# Patient Record
Sex: Male | Born: 1995 | Race: White | Hispanic: No | Marital: Single
Health system: Southern US, Community
[De-identification: ages and names within clinical notes are randomized; demographics above are authoritative.]

## PROBLEM LIST (undated history)

## (undated) DIAGNOSIS — F909 Attention-deficit hyperactivity disorder, unspecified type: Secondary | ICD-10-CM

## (undated) DIAGNOSIS — F319 Bipolar disorder, unspecified: Secondary | ICD-10-CM

## (undated) DIAGNOSIS — J309 Allergic rhinitis, unspecified: Secondary | ICD-10-CM

## (undated) DIAGNOSIS — R002 Palpitations: Secondary | ICD-10-CM

## (undated) HISTORY — DX: Bipolar disorder, unspecified: F31.9

## (undated) HISTORY — DX: Allergic rhinitis, unspecified: J30.9

## (undated) HISTORY — DX: Attention-deficit hyperactivity disorder, unspecified type: F90.9

## (undated) HISTORY — DX: Palpitations: R00.2

## (undated) HISTORY — PX: NO PAST SURGERIES: SHX2092

---
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
## 2015-11-10 ENCOUNTER — Encounter: Payer: Self-pay | Admitting: Family Medicine

## 2015-11-10 ENCOUNTER — Ambulatory Visit (INDEPENDENT_AMBULATORY_CARE_PROVIDER_SITE_OTHER): Payer: 59 | Admitting: Family Medicine

## 2015-11-10 VITALS — BP 125/81 | HR 79 | Temp 98.5°F | Wt 142.0 lb

## 2015-11-10 DIAGNOSIS — H6120 Impacted cerumen, unspecified ear: Secondary | ICD-10-CM | POA: Insufficient documentation

## 2015-11-10 DIAGNOSIS — H6122 Impacted cerumen, left ear: Secondary | ICD-10-CM | POA: Diagnosis not present

## 2015-11-10 DIAGNOSIS — H9192 Unspecified hearing loss, left ear: Secondary | ICD-10-CM | POA: Diagnosis not present

## 2015-11-10 MED ORDER — CARBAMIDE PEROXIDE 6.5 % OT SOLN: 5.0000 [drp] | Freq: Two times a day (BID) | OTIC | Status: DC

## 2015-11-10 NOTE — Patient Instructions (Signed)
Cerumen Impaction The structures of the external ear canal secrete a waxy substance known as cerumen. Excess cerumen can build up in the ear canal, causing a condition known as cerumen impaction. Cerumen impaction can cause ear pain and disrupt the function of the ear. The rate of cerumen production differs for each individual. In certain individuals, the configuration of the ear canal may decrease his or her ability to naturally remove cerumen. CAUSES Cerumen impaction is caused by excessive cerumen production or buildup. RISK FACTORS  Frequent use of swabs to clean ears.  Having narrow ear canals.  Having eczema.  Being dehydrated. SIGNS AND SYMPTOMS  Diminished hearing.  Ear drainage.  Ear pain.  Ear itch. TREATMENT Treatment may involve:  Over-the-counter or prescription ear drops to soften the cerumen.  Removal of cerumen by a health care provider. This may be done with:  Irrigation with warm water. This is the most common method of removal.  Ear curettes and other instruments.  Surgery. This may be done in severe cases. HOME CARE INSTRUCTIONS  Take medicines only as directed by your health care provider.  Do not insert objects into the ear with the intent of cleaning the ear. PREVENTION  Do not insert objects into the ear, even with the intent of cleaning the ear. Removing cerumen as a part of normal hygiene is not necessary, and the use of swabs in the ear canal is not recommended.  Drink enough water to keep your urine clear or pale yellow.  Control your eczema if you have it. SEEK MEDICAL CARE IF:  You develop ear pain.  You develop bleeding from the ear.  The cerumen does not clear after you use ear drops as directed.   This information is not intended to replace advice given to you by your health care provider. Make sure you discuss any questions you have with your health care provider.   Document Released: 10/07/2004 Document Revised: 09/20/2014  Document Reviewed: 04/16/2015 Elsevier Interactive Patient Education [employer]  I have called in debrox for you to use (especially right side) to clean. Left side is completely clear now.

## 2015-11-10 NOTE — Progress Notes (Signed)
Patient ID: Curtis Ramirez, male   DOB: 01/05/1996, 20 y.o.   MRN: [id number]    Curtis Ramirez, Curtis Ramirez [birth year], 20 y.o., male MRN: [id number]  CC: left ear cerumen Subjective: Pt presents for an acute OV with complaints of left ear with a clogged feeling of 1 week  duration. Associated symptoms include hearing loss.  Pt has tried cleaning ear with peroxide to ease their symptoms. He states the peroxide helped a little to relieve his symptoms. He denies nausea, vomit, ear pain , fever or chills.  Pt has not been seen by a provider  in over 3 years.   No Known Allergies Social History  Substance Use Topics  . Smoking status: Never Smoker   . Smokeless tobacco: Not on file  . Alcohol Use: No   Past Medical History  Diagnosis Date  . Attention deficit disorder with hyperactivity(314.01) 03/2013    mild to nonexistent per latest neuropsych testing per Dr. Archie Patten - now off straterra  . Allergic rhinitis, cause unspecified   . Bipolar I disorder (HCC)     mild, Dr. Archie Patten  . Migraine   . Palpitations     ?reviewed records from prior pcp - no mention of previous w/u   Past Surgical History  Procedure Laterality Date  . No past surgeries     Family History  Problem Relation Age of Onset  . Alcohol abuse    . Bipolar disorder    . Drug abuse       Medication List    Notice  As of 11/10/2015  2:00 PM   You have not been prescribed any medications.     ROS: Negative, with the exception of above mentioned in HPI  Objective:  BP 125/81 mmHg  Pulse 79  Temp(Src) 98.5 F (36.9 C)  Wt 142 lb (64.411 kg)  SpO2 99% There is no height on file to calculate BMI. Gen: Afebrile. No acute distress. Nontoxic in appearance.  HENT: AT. [place]. Bilateral TM unable to be visualized secondary to cerumen impaction.  MMM, no oral lesions. Bilateral nares without erythema or swelling. Throat without erythema or exudates.  Eyes:Pupils Equal Round Reactive to light, Extraocular  movements intact,  Conjunctiva without redness, discharge or icterus. Skin: No rashes, purpura or petechiae.  Neuro: Normal gait. PERLA. EOMi. Alert. Oriented x3   Procedure: Left cerumen impaction.  Peroxide solution and warm water lavage completed. Plastic ear currette used beginning 1/2 EAM canal, with easy removal of wax build up.  Pt tolerated procedure well.  Visualization of left TM competed after lavage, no erythema, bulging or perforation.   Assessment/Plan: Curtis Ramirez is a 20 y.o. male present for acute OV for  Cerumen impaction, left/hearing loss (Left) - pt with bilateral cerumen impaction on exam, however the left side is the only side causing hearing loss or discomfort.  - Removal of large amounts of left cerumen impaction with ear lavage and plastic ear curette today. Hearing restored.  - Discussed use of debrox BID until right side is cleared.  - carbamide peroxide (DEBROX) 6.5 % otic solution; Place 5 drops into both ears 2 (two) times daily.  Dispense: 15 mL; Refill: 0    electronically signed by:  Felix Pacini, DO  [facility] [place]

## 2016-05-31 ENCOUNTER — Encounter: Payer: Self-pay | Admitting: Family Medicine

## 2016-05-31 ENCOUNTER — Ambulatory Visit (INDEPENDENT_AMBULATORY_CARE_PROVIDER_SITE_OTHER): Payer: 59 | Admitting: Family Medicine

## 2016-05-31 ENCOUNTER — Telehealth: Payer: Self-pay | Admitting: Family Medicine

## 2016-05-31 ENCOUNTER — Ambulatory Visit (HOSPITAL_BASED_OUTPATIENT_CLINIC_OR_DEPARTMENT_OTHER)
Admission: RE | Admit: 2016-05-31 | Discharge: 2016-05-31 | Disposition: A | Discharge status: Home / Self Care | Payer: 59 | Source: Ambulatory Visit | Attending: Family Medicine | Admitting: Family Medicine

## 2016-05-31 VITALS — BP 114/75 | HR 73 | Temp 97.9°F | Resp 20 | Ht 69.0 in | Wt 142.5 lb

## 2016-05-31 DIAGNOSIS — Z23 Encounter for immunization: Secondary | ICD-10-CM

## 2016-05-31 DIAGNOSIS — M25531 Pain in right wrist: Secondary | ICD-10-CM | POA: Diagnosis not present

## 2016-05-31 DIAGNOSIS — Z0001 Encounter for general adult medical examination with abnormal findings: Secondary | ICD-10-CM | POA: Diagnosis not present

## 2016-05-31 DIAGNOSIS — Z0000 Encounter for general adult medical examination without abnormal findings: Secondary | ICD-10-CM | POA: Insufficient documentation

## 2016-05-31 NOTE — Patient Instructions (Signed)
1. Please try to get an updated record of your immunizations from either your school or family.   2. For your wrist: wear brace while at work only, unless it bothers you when driving etc. If it starts to bother you when not working then you can start to wear again. If painful take 400-600 mg advil every 8 hours, as needed for pain.   3. Please go get xray at [facility]. It is a right hand turn on [address] [place] of [address] [place]. Once I receive results I will guide you on plan.  [address] [place], [place] [address]  Health Maintenance, Male A healthy lifestyle and preventative care can promote health and wellness.  Maintain regular health, dental, and eye exams.  Eat a healthy diet. Foods like vegetables, fruits, whole grains, low-fat dairy products, and lean protein foods contain the nutrients you need and are low in calories. Decrease your intake of foods high in solid fats, added sugars, and salt. Get information about a proper diet from your health care provider, if necessary.  Regular physical exercise is one of the most important things you can do for your health. Most adults should get at least 150 minutes of moderate-intensity exercise (any activity that increases your heart rate and causes you to sweat) each week. In addition, most adults need muscle-strengthening exercises on 2 or more days a week.   Maintain a healthy weight. The body mass index (BMI) is a screening tool to identify possible weight problems. It provides an estimate of body fat based on height and weight. Your health care provider can find your BMI and can help you achieve or maintain a healthy weight. For males 20 years and older:  A BMI below 18.5 is considered underweight.  A BMI of 18.5 to 24.9 is normal.  A BMI of 25 to 29.9 is considered overweight.  A BMI of 30 and above is considered obese.  Maintain normal blood lipids and cholesterol by exercising and minimizing your  intake of saturated fat. Eat a balanced diet with plenty of fruits and vegetables. Blood tests for lipids and cholesterol should begin at age 55 and be repeated every 5 years. If your lipid or cholesterol levels are high, you are over age 71, or you are at high risk for heart disease, you may need your cholesterol levels checked more frequently.Ongoing high lipid and cholesterol levels should be treated with medicines if diet and exercise are not working.  If you smoke, find out from your health care provider how to quit. If you do not use tobacco, do not start.  Lung cancer screening is recommended for adults aged 55-80 years who are at high risk for developing lung cancer because of a history of smoking. A yearly low-dose CT scan of the lungs is recommended for people who have at least a 30-pack-year history of smoking and are current smokers or have quit within the past 15 years. A pack year of smoking is smoking an average of 1 pack of cigarettes a day for 1 year (for example, a 30-pack-year history of smoking could mean smoking 1 pack a day for 30 years or 2 packs a day for 15 years). Yearly screening should continue until the smoker has stopped smoking for at least 15 years. Yearly screening should be stopped for people who develop a health problem that would prevent them from having lung cancer treatment.  If you choose to drink alcohol, do not  have more than 2 drinks per day. One drink is considered to be 12 oz (360 mL) of beer, 5 oz (150 mL) of wine, or 1.5 oz (45 mL) of liquor.  Avoid the use of street drugs. Do not share needles with anyone. Ask for help if you need support or instructions about stopping the use of drugs.  High blood pressure causes heart disease and increases the risk of stroke. High blood pressure is more likely to develop in:  People who have blood pressure in the end of the normal range (100-139/85-89 mm Hg).  People who are overweight or obese.  People who are  African American.  If you are 41-41 years of age, have your blood pressure checked every 3-5 years. If you are 71 years of age or older, have your blood pressure checked every year. You should have your blood pressure measured twice--once when you are at a hospital or clinic, and once when you are not at a hospital or clinic. Record the average of the two measurements. To check your blood pressure when you are not at a hospital or clinic, you can use:  An automated blood pressure machine at a pharmacy.  A home blood pressure monitor.  If you are 41-50 years old, ask your health care provider if you should take aspirin to prevent heart disease.  Diabetes screening involves taking a blood sample to check your fasting blood sugar level. This should be done once every 3 years after age 80 if you are at a normal weight and without risk factors for diabetes. Testing should be considered at a younger age or be carried out more frequently if you are overweight and have at least 1 risk factor for diabetes.  Colorectal cancer can be detected and often prevented. Most routine colorectal cancer screening begins at the age of 59 and continues through age 4. However, your health care provider may recommend screening at an earlier age if you have risk factors for colon cancer. On a yearly basis, your health care provider may provide home test kits to check for hidden blood in the stool. A small camera at the end of a tube may be used to directly examine the colon (sigmoidoscopy or colonoscopy) to detect the earliest forms of colorectal cancer. Talk to your health care provider about this at age 10 when routine screening begins. A direct exam of the colon should be repeated every 5-10 years through age 12, unless early forms of precancerous polyps or small growths are found.  People who are at an increased risk for hepatitis B should be screened for this virus. You are considered at high risk for hepatitis B  if:  You were born in a country where hepatitis B occurs often. Talk with your health care provider about which countries are considered high risk.  Your parents were born in a high-risk country and you have not received a shot to protect against hepatitis B (hepatitis B vaccine).  You have HIV or AIDS.  You use needles to inject street drugs.  You live with, or have sex with, someone who has hepatitis B.  You are a man who has sex with other men (MSM).  You get hemodialysis treatment.  You take certain medicines for conditions like cancer, organ transplantation, and autoimmune conditions.  Hepatitis C blood testing is recommended for all people born from [birth year] through 1966 and any individual with known risk factors for hepatitis C.  Healthy men should no longer receive prostate-specific  antigen (PSA) blood tests as part of routine cancer screening. Talk to your health care provider about prostate cancer screening.  Testicular cancer screening is not recommended for adolescents or adult males who have no symptoms. Screening includes self-exam, a health care provider exam, and other screening tests. Consult with your health care provider about any symptoms you have or any concerns you have about testicular cancer.  Practice safe sex. Use condoms and avoid high-risk sexual practices to reduce the spread of sexually transmitted infections (STIs).  You should be screened for STIs, including gonorrhea and chlamydia if:  You are sexually active and are younger than 24 years.  You are older than 24 years, and your health care provider tells you that you are at risk for this type of infection.  Your sexual activity has changed since you were last screened, and you are at an increased risk for chlamydia or gonorrhea. Ask your health care provider if you are at risk.  If you are at risk of being infected with HIV, it is recommended that you take a prescription medicine daily to prevent HIV  infection. This is called pre-exposure prophylaxis (PrEP). You are considered at risk if:  You are a man who has sex with other men (MSM).  You are a heterosexual man who is sexually active with multiple partners.  You take drugs by injection.  You are sexually active with a partner who has HIV.  Talk with your health care provider about whether you are at high risk of being infected with HIV. If you choose to begin PrEP, you should first be tested for HIV. You should then be tested every 3 months for as long as you are taking PrEP.  Use sunscreen. Apply sunscreen liberally and repeatedly throughout the day. You should seek shade when your shadow is shorter than you. Protect yourself by wearing long sleeves, pants, a wide-brimmed hat, and sunglasses year round whenever you are outdoors.  Tell your health care provider of new moles or changes in moles, especially if there is a change in shape or color. Also, tell your health care provider if a mole is larger than the size of a pencil eraser.  A one-time screening for abdominal aortic aneurysm (AAA) and surgical repair of large AAAs by ultrasound is recommended for men aged 65-75 years who are current or former smokers.  Stay current with your vaccines (immunizations).   This information is not intended to replace advice given to you by your health care provider. Make sure you discuss any questions you have with your health care provider.   Document Released: 02/26/2008 Document Revised: 09/20/2014 Document Reviewed: 01/25/2011 Elsevier Interactive Patient Education 2016 Elsevier Inc.   Wrist Pain There are many things that can cause wrist pain. Some common causes include:  An injury to the wrist area, such as a sprain, strain, or fracture.  Overuse of the joint.  A condition that causes increased pressure on a nerve in the wrist (carpal tunnel syndrome).  Wear and tear of the joints that occurs with aging (osteoarthritis).  A  variety of other types of arthritis. Sometimes, the cause of wrist pain is not known. The pain often goes away when you follow your health care provider's instructions for relieving pain at home. If your wrist pain continues, tests may need to be done to diagnose your condition. HOME CARE INSTRUCTIONS Pay attention to any changes in your symptoms. Take these actions to help with your pain:  Rest the wrist area  for at least 48 hours or as told by your health care provider.  If directed, apply ice to the injured area:  Put ice in a plastic bag.  Place a towel between your skin and the bag.  Leave the ice on for 20 minutes, 2-3 times per day.  Keep your arm raised (elevated) above the level of your heart while you are sitting or lying down.  If a splint or elastic bandage has been applied, use it as told by your health care provider.  Remove the splint or bandage only as told by your health care provider.  Loosen the splint or bandage if your fingers become numb or have a tingling feeling, or if they turn cold or blue.  Take over-the-counter and prescription medicines only as told by your health care provider.  Keep all follow-up visits as told by your health care provider. This is important. SEEK MEDICAL CARE IF:  Your pain is not helped by treatment.  Your pain gets worse. SEEK IMMEDIATE MEDICAL CARE IF:  Your fingers become swollen.  Your fingers turn white, very red, or cold and blue.  Your fingers are numb or have a tingling feeling.  You have difficulty moving your fingers.   This information is not intended to replace advice given to you by your health care provider. Make sure you discuss any questions you have with your health care provider.   Document Released: 06/09/2005 Document Revised: 05/21/2015 Document Reviewed: 01/15/2015 Elsevier Interactive Patient Education [employer]2016 Elsevier Inc.

## 2016-05-31 NOTE — Progress Notes (Signed)
Patient ID: Curtis Ramirez, male  DOB: February 14, 1996, 20 y.o.   MRN: [id number] Patient Care Team    Relationship Specialty Notifications Start End  Curtis Leatherwoodenee A Curtis Suen, DO PCP - General Family Medicine  11/10/15     Subjective:  Curtis Ramirez is a 20 y.o. male present for CPE. All past medical history, surgical history, allergies, family history, immunizations, medications and social history were updated in the electronic medical record today. All recent labs, ED visits and hospitalizations within the last year were reviewed.  Right wrist pain: pt states his wrist had hurt for "quite sometime." Over the last 2 weeks, it has started to bother him more. He denies known injury, but states about 2 weeks ago he had a sharp shooting pain, that left him unable to move his wrist. He has a wrist splint on currently, and has been wearing it day and night since the sharp pain. He has not taken any medications to help with discomfort. He has some heavy boxes to lift at work, and he is on the computer frequently. He works at [employer] for 10 hour shifts. He denies numbness or tingling in his extremities.   Current Exercise Habits: The patient does not participate in regular exercise at present    Depression screen [facility] 2/9 05/31/2016  Decreased Interest 0  Down, Depressed, Hopeless 0  PHQ - 2 Score 0   Fall Risk  05/31/2016  Falls in the past year? No   Health maintenance:  Colonoscopy: No Fhx, screen at 50 Immunizations:  tdap > 10 years ago, influenza due Infectious disease screening: HIV unknown. If not completed prior, screening test offered if sexually active. Patient has a Dental home. Follows routinely.  Hospitalizations/ED visits: None.   Immunization History  Administered Date(s) Administered  . Influenza Split 07/09/2011, 06/30/2012  . Influenza,inj,Quad PF,36+ Mos 05/31/2016  . Tdap 05/31/2016    Past Medical History:  Diagnosis Date  .  Allergic rhinitis, cause unspecified   . Attention deficit disorder with hyperactivity(314.01) 03/2013   mild to nonexistent per latest neuropsych testing per Dr. Archie PattenMonroe - now off straterra  . Bipolar I disorder (HCC)    mild, Dr. Archie PattenMonroe  . Migraine   . Palpitations    ?reviewed records from prior pcp - no mention of previous w/u   No Known Allergies Past Surgical History:  Procedure Laterality Date  . NO PAST SURGERIES     Family History  Problem Relation Age of Onset  . Alcohol abuse    . Bipolar disorder    . Drug abuse     Social History   Social History  . Marital status: Single    Spouse name: N/A  . Number of children: N/A  . Years of education: N/A   Occupational History  . student    Social History Main Topics  . Smoking status: Never Smoker  . Smokeless tobacco: Never Used  . Alcohol use No  . Drug use: No  . Sexual activity: No   Other Topics Concern  . Not on file   Social History Narrative   Lives with grandparents, uncle, great grandmother, outside cats   Father died when patient was 89 y/o. Visualized his traumatic death    Mother with bipolar, drugs. Abandoned. Estranged, not involved.   Multiple school suspensions   Not doing well in school   [employer] computers, online RPG's; Wii, DS   No GF; not sexually active  Medication List    as of 05/31/2016  3:21 PM   You have not been prescribed any medications.      No results found for this or any previous visit (from the past 2160 hour(s)).  No results found.   ROS: 14 pt review of systems performed and negative (unless mentioned in an HPI)  Objective: BP 114/75 (BP Location: Left Arm, Patient Position: Sitting, Cuff Size: Normal)   Pulse 73   Temp 97.9 F (36.6 C) (Oral)   Resp 20   Ht 5\' 9"  (1.753 m)   Wt 142 lb 8 oz (64.6 kg)   BMI 21.04 kg/m  Gen: Afebrile. No acute distress. Nontoxic in appearance, well-developed, well-nourished,  Thin caucasian male.  HENT: AT. [place]. Bilateral  TM visualized and normal in appearance, normal external auditory canal. MMM, no oral lesions, adequate dentition. Bilateral nares within normal limits. Throat without erythema, ulcerations or exudates. no Cough on exam, no hoarseness on exam. Eyes:Pupils Equal Round Reactive to light, Extraocular movements intact,  Conjunctiva without redness, discharge or icterus. Neck/lymp/endocrine: Supple,no lymphadenopathy, no thyromegaly CV: RRR no murmur, no edema, +2/4 P posterior tibialis pulses.  Chest: CTAB, no wheeze, rhonchi or crackles. normal Respiratory effort. good Air movement. Abd: Soft. flat. NTND. BS present. no Masses palpated. No hepatosplenomegaly. No rebound tenderness or guarding. Skin: no rashes, purpura or petechiae. Warm and well-perfused. Skin intact. MSK: No erythema or soft tissue swelling. Mild TTP triquetral/lunate/pisiform area. Full ROM, mild discomfort with resisted supination, extension and ulnar deviation. Negative tinel at wrist and elbow. + Phalen. NV intact distally.  Neuro: Normal gait. PERLA. EOMi. Alert. Oriented x3.   DTRs equal bilaterally. Psych: Normal affect, dress and demeanor. Normal speech. Normal thought content and judgment.  Assessment/plan: Curtis Ramirez is a 20 y.o. male present for CPE with acute compliant.  Encounter for preventive health examination Patient was encouraged to exercise greater than 150 minutes a week. Patient was encouraged to choose a diet filled with fresh fruits and vegetables, and lean meats. AVS provided to patient today for education/recommendation on gender specific health and safety maintenance. - pt to attempt to get records from HS or grandparents on immunizations. Influenza vaccine administered - Flu Vaccine QUAD 36+ mos PF IM (Fluarix & Fluzone Quad PF) Need for Tdap vaccination - Tdap vaccine greater than or equal to 7yo IM  Right wrist pain - only wear wrist splint when working, and if becomes symptomatic  when not working.  - NSAIDS for pain PRN.  - Xray today, consider referral to either sports med or ortho depending on xray and follow up.  - DG Wrist Complete Right; Future - Does not appear to be carpal tunnel related, pain is more ulnar aspect of wrist, and no numbness/tingling. Likely muscle vs ligament strain.  - F/U dependent on xray, if normal f/u 2 weeks if not improved.    Return in about 2 weeks (around 06/14/2016), or wrist pain.  Electronically signed by: Felix Pacini, DO [facility] [place]

## 2016-05-31 NOTE — Telephone Encounter (Signed)
Please call pt: - his xray is normal.  I believe this is a wrist strain - continue wearing wrist splint during work hours. If it is painful when not in use, he can resume brace use during waking hours. If pain is not improving or worsening, would want to follow up in 2 weeks.  - Use Advil as instructed for pain.

## 2016-06-01 NOTE — Telephone Encounter (Signed)
Patient not available at this time.

## 2016-06-04 NOTE — Telephone Encounter (Signed)
Spoke with patient reviewed xray results and instructions. Patient verbalized understanding. 

## 2016-06-14 ENCOUNTER — Ambulatory Visit (INDEPENDENT_AMBULATORY_CARE_PROVIDER_SITE_OTHER): Payer: 59 | Admitting: Family Medicine

## 2016-06-14 VITALS — BP 112/70 | HR 70 | Temp 98.3°F | Resp 16 | Wt 144.0 lb

## 2016-06-14 DIAGNOSIS — M25531 Pain in right wrist: Secondary | ICD-10-CM | POA: Diagnosis not present

## 2016-06-14 NOTE — Patient Instructions (Signed)
Try not to over extend wrist. You can wear the wrist splint as needed and for heavy lifting. You can continue to use the advil as needed for pain, never more than suggested on the bottle label. It does not have to be scheduled every 8 hours anymore.  I will send you to sports medicine to follow your continue wrist pain with weight.

## 2016-06-14 NOTE — Progress Notes (Signed)
Patient ID: Curtis Ramirez, male  DOB: 14-Aug-1996, 20 y.o.   MRN: [id number] Patient Care Team    Relationship Specialty Notifications Start End  Natalia Leatherwoodenee A Kuneff, DO PCP - General Family Medicine  11/10/15     Subjective:  Curtis Ramirez is a 20 y.o. male follow up for right wrist pain.    Right wrist pain: Pt has been trying to avoid heavy lifting. He has been wearing his wrist splint during work hours. Xray was normal at last visit. He has taken advil scheduled as dicussed. He states it is improved from prior visit but still hurts pretty bad, especially with weight (pushing himself up).   Prior note: pt states his wrist had hurt for "quite sometime." Over the last 2 weeks, it has started to bother him more. He denies known injury, but states about 2 weeks ago he had a sharp shooting pain, that left him unable to move his wrist. He has a wrist splint on currently, and has been wearing it day and night since the sharp pain. He has not taken any medications to help with discomfort. He has some heavy boxes to lift at work, and he is on the computer frequently. He works at [employer] for 10 hour shifts. He denies numbness or tingling in his extremities.        Depression screen PHQ 2/9 05/31/2016  Decreased Interest 0  Down, Depressed, Hopeless 0  PHQ - 2 Score 0   Fall Risk  05/31/2016  Falls in the past year? No   Health maintenance:  Colonoscopy: No Fhx, screen at 50 Immunizations:  tdap > 10 years ago, influenza due Infectious disease screening: HIV unknown. If not completed prior, screening test offered if sexually active. Patient has a Dental home. Follows routinely.  Hospitalizations/ED visits: None.   Immunization History  Administered Date(s) Administered  . Influenza Split 07/09/2011, 06/30/2012  . Influenza,inj,Quad PF,36+ Mos 05/31/2016  . Tdap 05/31/2016    Past Medical History:  Diagnosis Date  . Allergic rhinitis, cause  unspecified   . Attention deficit disorder with hyperactivity(314.01) 03/2013   mild to nonexistent per latest neuropsych testing per Dr. Archie PattenMonroe - now off straterra  . Bipolar I disorder (HCC)    mild, Dr. Archie PattenMonroe  . Migraine   . Palpitations    ?reviewed records from prior pcp - no mention of previous w/u   No Known Allergies Past Surgical History:  Procedure Laterality Date  . NO PAST SURGERIES     Family History  Problem Relation Age of Onset  . Alcohol abuse    . Bipolar disorder    . Drug abuse     Social History   Social History  . Marital status: Single    Spouse name: N/A  . Number of children: N/A  . Years of education: N/A   Occupational History  . student    Social History Main Topics  . Smoking status: Never Smoker  . Smokeless tobacco: Never Used  . Alcohol use No  . Drug use: No  . Sexual activity: No   Other Topics Concern  . Not on file   Social History Narrative   Lives with grandparents, uncle, great grandmother, outside cats   Father died when patient was 89 y/o. Visualized his traumatic death    Mother with bipolar, drugs. Abandoned. Estranged, not involved.   Multiple school suspensions   Not doing well in school   [employer] computers, online  RPG's; Wii, DS   No GF; not sexually active     Medication List    as of 06/14/2016  1:55 PM   You have not been prescribed any medications.      No results found for this or any previous visit (from the past 2160 hour(s)).  No results found.   ROS: 14 pt review of systems performed and negative (unless mentioned in an HPI)  Objective: BP 112/70 (BP Location: Right Arm, Patient Position: Sitting, Cuff Size: Normal)   Pulse 70   Temp 98.3 F (36.8 C) (Oral)   Resp 16   Wt 144 lb (65.3 kg)   BMI 21.27 kg/m  Gen: Afebrile. No acute distress. Nontoxic in appearance, well-developed, well-nourished,  Thin caucasian male.  HENT: AT. [place].MMM Eyes:Pupils Equal Round Reactive to light, Extraocular  movements intact,  Conjunctiva without redness, discharge or icterus. Skin: no rashes, purpura or petechiae. Warm and well-perfused. Skin intact. MSK: No erythema or soft tissue swelling. Very Mild TTP triquetral/lunate/pisiform area. Full ROM, mild discomfort with resisted supination, extension. Negative tinel at wrist and elbow. + Phalen. NV intact distally.  Neuro: Normal gait. PERLA. EOMi. Alert. Oriented x3.   DTRs equal bilaterally.  Dg Wrist Complete Right  Result Date: 05/31/2016 CLINICAL DATA:  Lifting injury.  Pain. EXAM: RIGHT WRIST - COMPLETE 3+ VIEW COMPARISON:  No recent prior. FINDINGS: No acute bony or joint abnormality identified. No evidence of fracture or dislocation. IMPRESSION: No acute abnormality. Electronically Signed   By: Maisie Fus  Register   On: 05/31/2016 16:25   Assessment/plan: Lindajo Royal Ramirez is a 20 y.o. male present for CPE with acute compliant.  Right wrist pain - only wear wrist splint when working. DO not bear weight on your wrist.  - NSAIDS for pain PRN. Not scheduled now.  - Xray was normal. - referral to sports medicine for continued discomfort.  - F/U PRN  Electronically signed by: Felix Pacini, DO [facility] [place]

## 2016-06-22 ENCOUNTER — Encounter: Payer: Self-pay | Admitting: Family Medicine

## 2016-06-22 ENCOUNTER — Ambulatory Visit (INDEPENDENT_AMBULATORY_CARE_PROVIDER_SITE_OTHER): Payer: 59 | Admitting: Family Medicine

## 2016-06-22 DIAGNOSIS — M25531 Pain in right wrist: Secondary | ICD-10-CM | POA: Diagnosis not present

## 2016-06-22 MED ORDER — MELOXICAM 15 MG PO TABS: 15.0000 mg | ORAL_TABLET | Freq: Every day | ORAL | 0 refills | Status: DC

## 2016-06-22 NOTE — Assessment & Plan Note (Signed)
Pain is not necessarily consistent with carpal tunnel syndrome. Ultrasound of the right compared to the left was equivocal. This is suggestive of a wrist strain. Does not appear to have any scapholunate instability. - Initiate mobic 15 mg on a daily basis. - Encouraged to wear a wrist splint on a regular basis for the next 2 weeks. - If no improvement he will follow-up in 2 weeks. Can consider formal physical therapy at that time versus another ultrasound scan. Most likely this is too early to consider nerve conduction exam.

## 2016-06-22 NOTE — Progress Notes (Signed)
  Curtis Ramirez - 20 y.o. male MRN [id number]  Date of birth: 22-Mar-1996  SUBJECTIVE:  Including CC & ROS.  Chief Complaint  Patient presents with  . Wrist Pain    Curtis Ramirez is a 20 year old male that is presenting with right wrist pain. The pain started about 2 weeks ago. He has the pain on the volar and dorsal aspect of his carpal bones. He reports the pain is exacerbated with activities at work. He currently works for [employer]. He also plays videogames several hours of the day. He has a history of carpal tunnel syndrome in the right wrist. He has worn a brace intermittently on the right wrist with no improvement. He denies any radiation into his fingers or any radiation proximally. He denies a prior injury or surgery to the area. He reports his symptoms are staying the same. He does not associate the pain with any specific time of day. He has pain when he is trying to lift off while seated.  ROS: No unexpected weight loss, fever, chills, swelling, instability, numbness/tingling, redness, otherwise see HPI   HISTORY: Past Medical, Surgical, Social, and Family History Reviewed & Updated per EMR.   Pertinent Historical Findings include: PMSHx -  Bipolar,  PSHx -  No tobacco or alcohol use, Works at [employer]  FHx -  Unsure of history  Medications - none   DATA REVIEWED: 05/31/16: right wrist x-ray: No acute abnormality.  PHYSICAL EXAM:  VS: BP:126/70  HR: bpm  TEMP: ( )  RESP:   HT:5\' 11"  (180.3 cm)   WT:143 lb (64.9 kg)  BMI:20 PHYSICAL EXAM: Gen: NAD, alert, cooperative with exam, well-appearing HEENT: clear conjunctiva, EOMI CV:  no edema, capillary refill brisk,  Resp: non-labored, normal speech Skin: no rashes, normal turgor  Neuro: no gross deficits.  Psych:  alert and oriented Hand and Wrist:  No signs of atrophy of the thenar or hypothenar eminence Normal wrist range of motion in flexion, extension, ulnar and radial deviation. Normal thumb  opposition. Normal finger abduction and abduction. Normal grip strength. Negative Tinel's test with hand elevation . No scapholunate instability Negative Finkelstein's test. Positive lift off test  Neurovascularly intact  Limited ultrasound: Right wrist: The first dorsal compartment was reviewed and found to be normal. The sixth dorsal compartment was also reviewed and found to be normal. The TFCC was found to be normal. The carpal tunnel was viewed in the median nerve appeared to be normal in nature. As compared to the left wrist as well.   ASSESSMENT & PLAN:   Right wrist pain Pain is not necessarily consistent with carpal tunnel syndrome. Ultrasound of the right compared to the left was equivocal. This is suggestive of a wrist strain. Does not appear to have any scapholunate instability. - Initiate mobic 15 mg on a daily basis. - Encouraged to wear a wrist splint on a regular basis for the next 2 weeks. - If no improvement he will follow-up in 2 weeks. Can consider formal physical therapy at that time versus another ultrasound scan. Most likely this is too early to consider nerve conduction exam.

## 2016-07-13 ENCOUNTER — Ambulatory Visit (INDEPENDENT_AMBULATORY_CARE_PROVIDER_SITE_OTHER): Payer: 59 | Admitting: Family Medicine

## 2016-07-13 ENCOUNTER — Encounter: Payer: Self-pay | Admitting: Family Medicine

## 2016-07-13 DIAGNOSIS — M25531 Pain in right wrist: Secondary | ICD-10-CM | POA: Diagnosis not present

## 2016-07-13 NOTE — Progress Notes (Signed)
  Lissa HoardJulien C Saulenas Hendricks - 20 y.o. male MRN [id number]  Date of birth: 05/23/1996  SUBJECTIVE:  Including CC & ROS.  No chief complaint on file.   Horton ChinJulien is a 20 year old male that is following up for his right wrist pain. He was diagnosed with a wrist sprain and has had significant improvement. He mainly has pain when he is at work but will take a break and is able to manage his pain. He has only had a couple of incidences where his pain was significant. He is been doing range of motion exercises for his wrist which seemed to help.  ROS: No unexpected weight loss, fever, chills, swelling, instability, muscle pain, numbness/tingling, redness, otherwise see HPI    HISTORY: Past Medical, Surgical, Social, and Family History Reviewed & Updated per EMR.   Pertinent Historical Findings include:  PSHx -  No tobacco or alcohol use   DATA REVIEWED:  previous ultrasound  PHYSICAL EXAM:  VS: BP:122/77  HR: bpm  TEMP: ( )  RESP:   HT:5\' 11"  (180.3 cm)   WT:143 lb (64.9 kg)  BMI:20 PHYSICAL EXAM: Gen: NAD, alert, cooperative with exam, well-appearing HEENT: clear conjunctiva, EOMI CV:  no edema, capillary refill brisk,  Resp: non-labored, normal speech Skin: no rashes, normal turgor  Neuro: no gross deficits.  Psych:  alert and oriented Right wrist:  No sign of atrophy of the thenar or hyperthenar eminence. Normal range of motion in all directions. Normal thumb opposition. Normal finger abduction and abduction. No significant pain to palpation over the carpal bones.  ASSESSMENT & PLAN:   No problem-specific Assessment & Plan notes found for this encounter.

## 2016-07-15 NOTE — Assessment & Plan Note (Signed)
Pain has significantly improved since last time. He knows the ways to try to accommodate himself at work. - Provided with home exercises sheet. - Can continue to take medication for pain as needed. - Follow-up as needed

## 2016-10-11 ENCOUNTER — Ambulatory Visit (INDEPENDENT_AMBULATORY_CARE_PROVIDER_SITE_OTHER): Payer: 59 | Admitting: Family Medicine

## 2016-10-11 ENCOUNTER — Encounter: Payer: Self-pay | Admitting: Family Medicine

## 2016-10-11 ENCOUNTER — Ambulatory Visit (HOSPITAL_BASED_OUTPATIENT_CLINIC_OR_DEPARTMENT_OTHER)
Admission: RE | Admit: 2016-10-11 | Discharge: 2016-10-11 | Disposition: A | Discharge status: Home / Self Care | Payer: 59 | Source: Ambulatory Visit | Attending: Family Medicine | Admitting: Family Medicine

## 2016-10-11 VITALS — BP 130/78 | HR 73 | Temp 98.5°F | Resp 20 | Wt 153.5 lb

## 2016-10-11 DIAGNOSIS — M79644 Pain in right finger(s): Secondary | ICD-10-CM | POA: Diagnosis not present

## 2016-10-11 DIAGNOSIS — S6990XA Unspecified injury of unspecified wrist, hand and finger(s), initial encounter: Secondary | ICD-10-CM

## 2016-10-11 DIAGNOSIS — X58XXXA Exposure to other specified factors, initial encounter: Secondary | ICD-10-CM | POA: Insufficient documentation

## 2016-10-11 DIAGNOSIS — S6991XA Unspecified injury of right wrist, hand and finger(s), initial encounter: Secondary | ICD-10-CM | POA: Diagnosis not present

## 2016-10-11 NOTE — Progress Notes (Signed)
    Curtis Ramirez , 1996-02-13, 21 y.o., male MRN: [id number] Patient Care Team    Relationship Specialty Notifications Start End  Natalia Leatherwoodenee A Therron Sells, DO PCP - General Family Medicine  11/10/15     CC: finger injury Subjective: Pt presents for an acute OV with complaints of right 5th finger injury of 1 day  duration.  He states he hit off the wood part of his chair yesterday. He endorses pain and swelling over the back of his hand and discomfort with bending hand. He has wrapped his finger and splinted it. He also endorses icing it. He is required to wear a glove at work and states he is scheduled to work [employer].   No Known Allergies Social History  Substance Use Topics  . Smoking status: Never Smoker  . Smokeless tobacco: Never Used  . Alcohol use No   Past Medical History:  Diagnosis Date  . Allergic rhinitis, cause unspecified   . Attention deficit disorder with hyperactivity(314.01) 03/2013   mild to nonexistent per latest neuropsych testing per Dr. Archie PattenMonroe - now off straterra  . Bipolar I disorder (HCC)    mild, Dr. Archie PattenMonroe  . Migraine   . Palpitations    ?reviewed records from prior pcp - no mention of previous w/u   Past Surgical History:  Procedure Laterality Date  . NO PAST SURGERIES     Family History  Problem Relation Age of Onset  . Alcohol abuse    . Bipolar disorder    . Drug abuse     Allergies as of 10/11/2016   No Known Allergies     Medication List    as of 10/11/2016  4:27 PM   You have not been prescribed any medications.     No results found for this or any previous visit (from the past 24 hour(s)). No results found.   ROS: Negative, with the exception of above mentioned in HPI  Objective:  BP 130/78 (BP Location: Right Arm, Patient Position: Sitting, Cuff Size: Normal)   Pulse 73   Temp 98.5 F (36.9 C)   Resp 20   Wt 153 lb 8 oz (69.6 kg)   SpO2 99%   BMI 21.41 kg/m  Body mass index is 21.41 kg/m. Gen: Afebrile. No acute  distress. Nontoxic in appearance, well developed, well nourished.  HENT: AT. [place].  MMM MSK: Mild swelling right 4th/5th metacarpal. No bruising noted. Sensation intact distally. TTP proximal 5th metacarpal. Mild decrease in flexion, with discomfort. Good cap refill.   Assessment/Plan: Curtis Ramirez is a 21 y.o. male present for acute OV for  Finger injury, initial encounter - xray needs completed today.  - work note provided for today.  - area was re-wrapped, advised to keep wrapped until we known for certain if there is a fracture or not.  - mobic for swelling and pain.  - Continue  ice. - DG Hand Complete Right; Future  - if fractured will refer to orthopedics.    electronically signed by:  Felix Pacinienee Aditri Louischarles, DO  [facility] [place]

## 2016-10-11 NOTE — Patient Instructions (Addendum)
Please have xray completed today. I will call you tomorrow with results. Keep wrapped until you hear from us the results.  Take the Meloxicam once daily with food to help with pain/swelling.  Work excuse provided for today.

## 2016-10-12 ENCOUNTER — Telehealth: Payer: Self-pay | Admitting: Family Medicine

## 2016-10-12 NOTE — Telephone Encounter (Signed)
Grandmother notified per DPR.  Verbalized understanding.

## 2016-10-12 NOTE — Telephone Encounter (Signed)
Please call pt: - his xray did not show a fracture. Would recommend NSAIDS, icing if helps and movement. I would not wrap any further.

## 2017-07-19 ENCOUNTER — Telehealth: Payer: Self-pay | Admitting: Family Medicine

## 2017-07-19 NOTE — Telephone Encounter (Signed)
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  Please advise if this can be given as nurse visit.

## 2017-07-19 NOTE — Telephone Encounter (Signed)
Patient is up to date had tdap last year notified patient.

## 2017-10-16 IMAGING — DX DG WRIST COMPLETE 3+V*R*
4 series · 4 of 4 positions shown · non-contrast
Comparison: No recent prior.

CLINICAL DATA: Lifting injury.  Pain.

EXAM:
RIGHT WRIST - COMPLETE 3+ VIEW

[wrist pa]
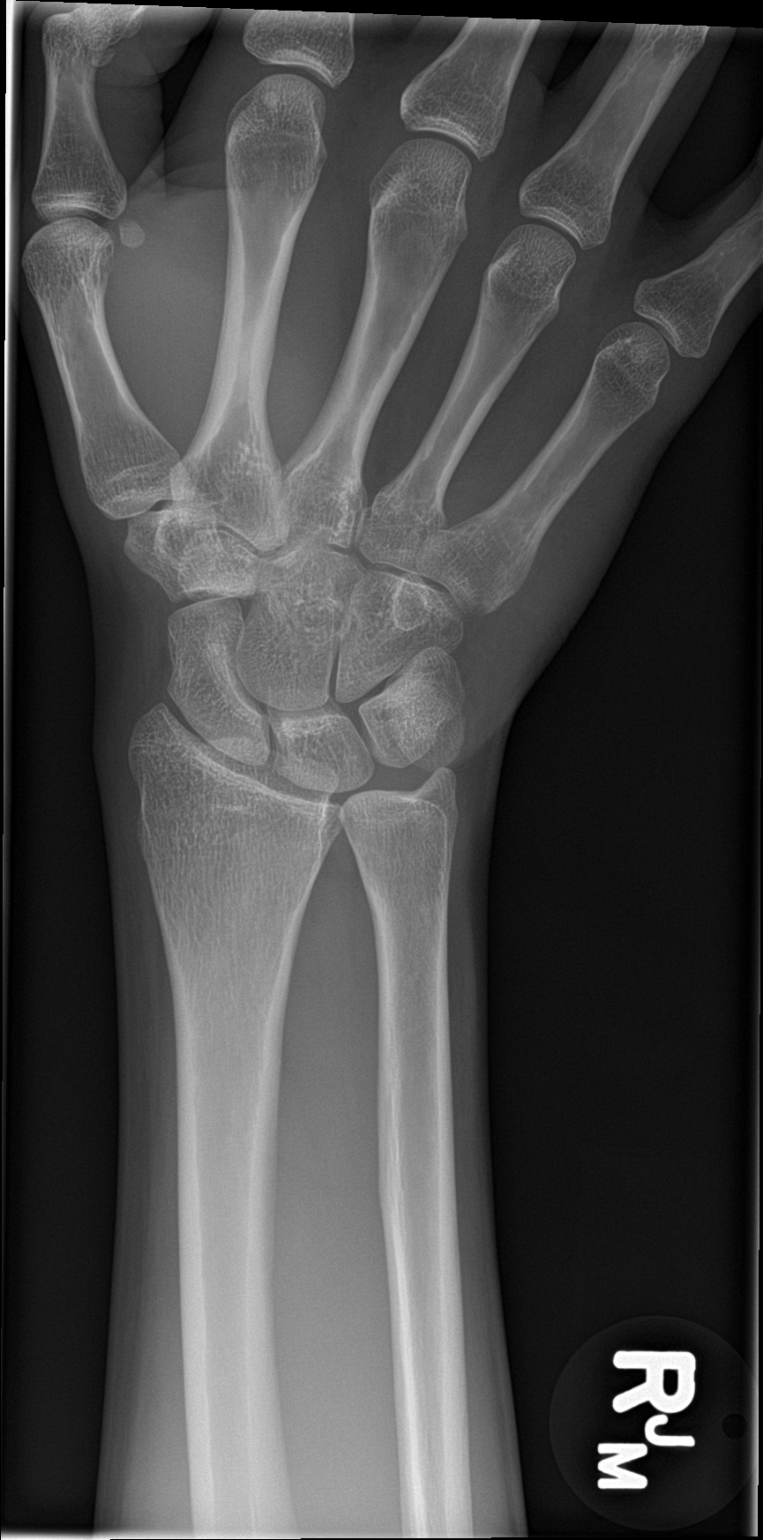

[wrist obl]
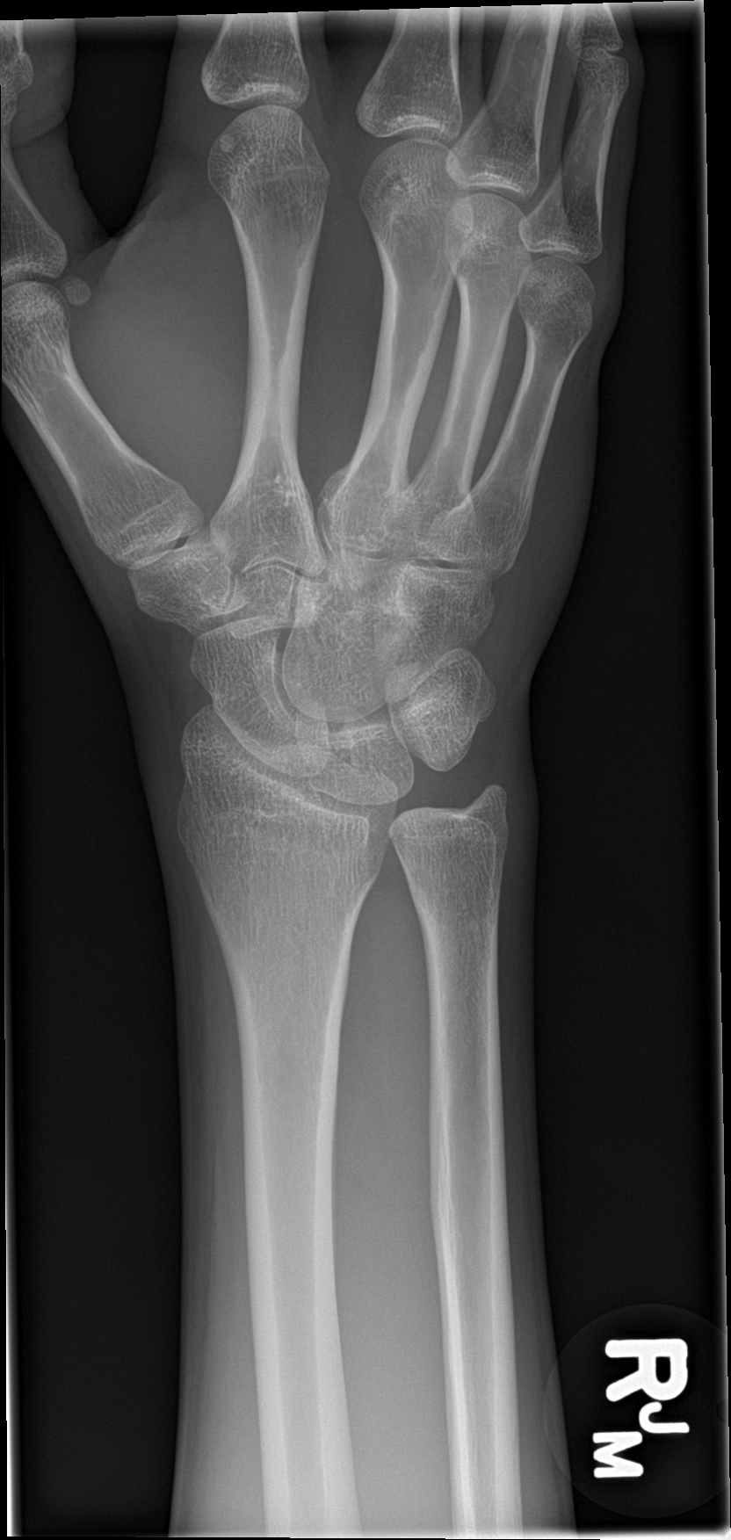

[wrist lat]
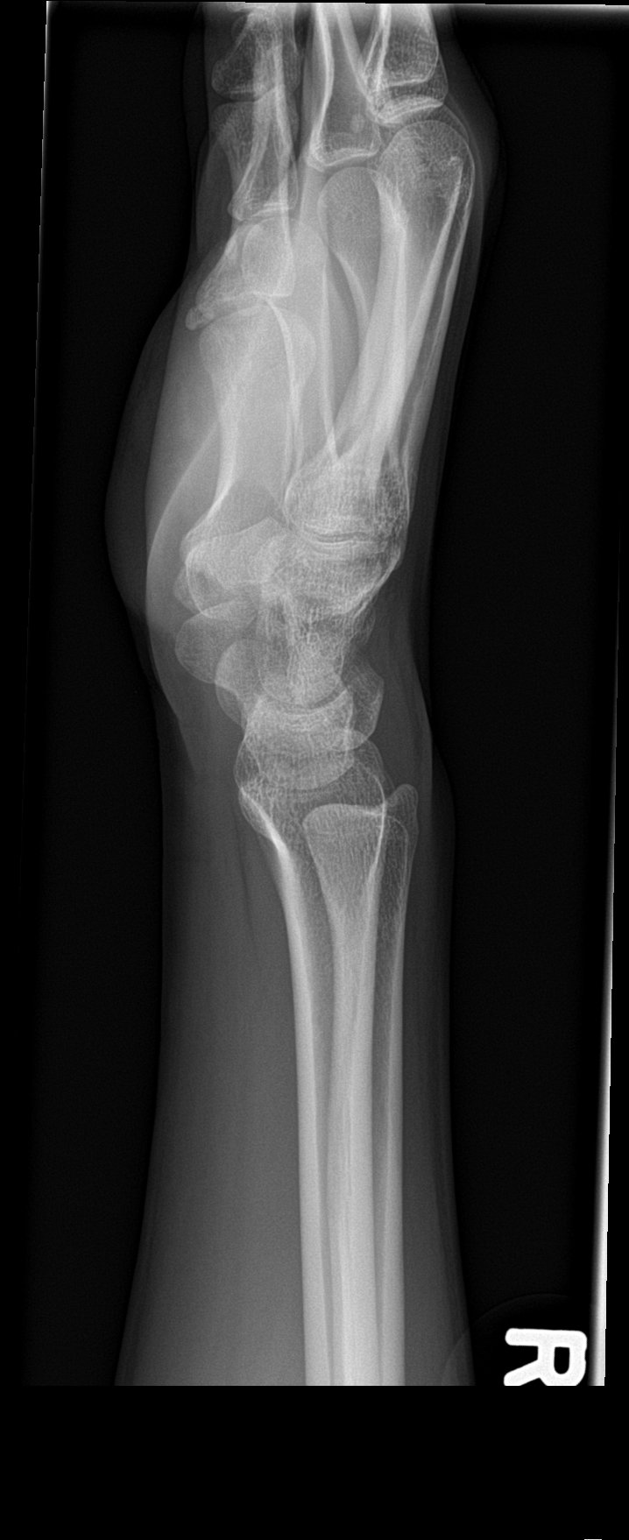

[wrist navicular]
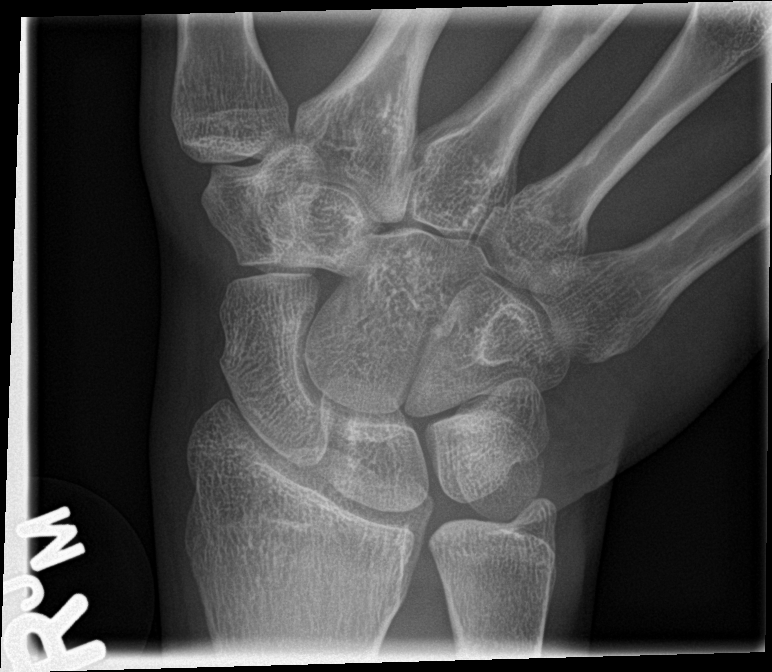

[4 of 4 positions shown; findings below may reference images not displayed]

FINDINGS: No acute bony or joint abnormality identified. No evidence of
fracture or dislocation.
IMPRESSION: No acute abnormality.

## 2018-01-26 ENCOUNTER — Ambulatory Visit: Payer: 59 | Admitting: Family Medicine

## 2018-01-26 ENCOUNTER — Encounter: Payer: Self-pay | Admitting: Family Medicine

## 2018-01-26 VITALS — BP 105/72 | HR 80 | Temp 98.5°F | Resp 16 | Ht 71.0 in | Wt 163.0 lb

## 2018-01-26 DIAGNOSIS — J0100 Acute maxillary sinusitis, unspecified: Secondary | ICD-10-CM

## 2018-01-26 MED ORDER — AMOXICILLIN-POT CLAVULANATE 875-125 MG PO TABS: 1.0000 | ORAL_TABLET | Freq: Two times a day (BID) | ORAL | 0 refills | Status: DC

## 2018-01-26 NOTE — Progress Notes (Signed)
Curtis Ramirez [place] , 05-Apr-1996, 22 y.o., male MRN: [id number] Patient Care Team    Relationship Specialty Notifications Start End  Natalia Leatherwood, DO PCP - General Family Medicine  11/10/15     Chief Complaint  Patient presents with  . URI    sore thoat, dry cough, runny nose x 4-5 days     Subjective: Pt presents for an OV with complaints of sore throat of 5 days duration.  Associated symptoms include dry cough, runny nose, hoarseness.  He denies fever, chills, headache, shortness of breath or ear pain.  He reports he is drinking plenty of fluids and resting. No asthma history.  Not a smoker.   Depression screen [facility] 2/9 01/26/2018 07/13/2016 06/22/2016 05/31/2016  Decreased Interest 0 0 0 0  Down, Depressed, Hopeless 0 0 0 0  PHQ - 2 Score 0 0 0 0    No Known Allergies Social History   Tobacco Use  . Smoking status: Never Smoker  . Smokeless tobacco: Never Used  Substance Use Topics  . Alcohol use: No   Past Medical History:  Diagnosis Date  . Allergic rhinitis, cause unspecified   . Attention deficit disorder with hyperactivity(314.01) 03/2013   mild to nonexistent per latest neuropsych testing per Dr. Archie Patten - now off straterra  . Bipolar I disorder (HCC)    mild, Dr. Archie Patten  . Migraine   . Palpitations    ?reviewed records from prior pcp - no mention of previous w/u   Past Surgical History:  Procedure Laterality Date  . NO PAST SURGERIES     Family History  Problem Relation Age of Onset  . Alcohol abuse Unknown   . Bipolar disorder Unknown   . Drug abuse Unknown    Allergies as of 01/26/2018   No Known Allergies     Medication List    as of 01/26/2018  9:01 AM   You have not been prescribed any medications.     All past medical history, surgical history, allergies, family history, immunizations andmedications were updated in the EMR today and reviewed under the history and medication portions of their EMR.     ROS: Negative, with the  exception of above mentioned in HPI   Objective:  BP 105/72 (BP Location: Left Arm, Patient Position: Sitting, Cuff Size: Normal)   Pulse 80   Temp 98.5 F (36.9 C) (Oral)   Resp 16   Ht  (1.803 m)   Wt 163 lb (73.9 kg)   SpO2 100%   BMI 22.73 kg/m  Body mass index is 22.73 kg/m. Gen: Afebrile. No acute distress. Nontoxic in appearance, well developed, well nourished.  HENT: AT. [place]. Bilateral TM visualized with mild fullness, no erythema. MMM, no oral lesions. Bilateral nares with drainage and mild erythema. Throat with mild erythema, no exudates.  Cough present.  No tenderness to sinus palpation. Eyes:Pupils Equal Round Reactive to light, Extraocular movements intact,  Conjunctiva without redness, discharge or icterus. Neck/lymp/endocrine: Supple, no lymphadenopathy CV: RRR  Chest: CTAB, no wheeze or crackles. Good air movement, normal resp effort.  Abd: Soft. NTND. BS present.  Skin: no rashes, purpura or petechiae.  Neuro:  Normal gait. PERLA. EOMi. Alert. Oriented x3   No exam data present No results found. No results found for this or any previous visit (from the past 24 hour(s)).  Assessment/Plan: Curtis Ramirez is a 22 y.o. male present for OV for  Acute non-recurrent maxillary sinusitis Rest, hydrate.  +  flonase, mucinex (DM if cough), nettie pot or nasal saline.  amox prescribed if not improved by Saturday or wrosening, take until completed.  If cough present it can last up to 6-8 weeks.  F/U 2 weeks of not improved.    Reviewed expectations re: course of current medical issues.  Discussed self-management of symptoms.  Outlined signs and symptoms indicating need for more acute intervention.  Patient verbalized understanding and all questions were answered.  Patient received an After-Visit Summary.    No orders of the defined types were placed in this encounter.    Note is dictated utilizing voice recognition software. Although note  has been proof read prior to signing, occasional typographical errors still can be missed. If any questions arise, please do not hesitate to call for verification.   electronically signed by:  Felix Pacini, DO  [facility] [place]

## 2018-01-26 NOTE — Patient Instructions (Addendum)
Rest, hydrate.  + flonase, mucinex (DM if cough), and  nasal saline.  amox  prescribed if not improved by Saturday or wrosening, take until completed.  If cough present it can last up to 6-8 weeks.  F/U 2 weeks of not improved.   Sinusitis, Adult Sinusitis is soreness and inflammation of your sinuses. Sinuses are hollow spaces in the bones around your face. They are located:  Around your eyes.  In the middle of your forehead.  Behind your nose.  In your cheekbones.  Your sinuses and nasal passages are lined with a stringy fluid (mucus). Mucus normally drains out of your sinuses. When your nasal tissues get inflamed or swollen, the mucus can get trapped or blocked so air cannot flow through your sinuses. This lets bacteria, viruses, and funguses grow, and that leads to infection. Follow these instructions at home: Medicines  Take, use, or apply over-the-counter and prescription medicines only as told by your doctor. These may include nasal sprays.  If you were prescribed an antibiotic medicine, take it as told by your doctor. Do not stop taking the antibiotic even if you start to feel better. Hydrate and Humidify  Drink enough water to keep your pee (urine) clear or pale yellow.  Use a cool mist humidifier to keep the humidity level in your home above 50%.  Breathe in steam for 10-15 minutes, 3-4 times a day or as told by your doctor. You can do this in the bathroom while a hot shower is running.  Try not to spend time in cool or dry air. Rest  Rest as much as possible.  Sleep with your head raised (elevated).  Make sure to get enough sleep each night. General instructions  Put a warm, moist washcloth on your face 3-4 times a day or as told by your doctor. This will help with discomfort.  Wash your hands often with soap and water. If there is no soap and water, use hand sanitizer.  Do not smoke. Avoid being around people who are smoking (secondhand smoke).  Keep all  follow-up visits as told by your doctor. This is important. Contact a doctor if:  You have a fever.  Your symptoms get worse.  Your symptoms do not get better within 10 days. Get help right away if:  You have a very bad headache.  You cannot stop throwing up (vomiting).  You have pain or swelling around your face or eyes.  You have trouble seeing.  You feel confused.  Your neck is stiff.  You have trouble breathing. This information is not intended to replace advice given to you by your health care provider. Make sure you discuss any questions you have with your health care provider. Document Released: 02/16/2008 Document Revised: 04/25/2016 Document Reviewed: 06/25/2015 Elsevier Interactive Patient Education  [employer].

## 2018-07-05 ENCOUNTER — Ambulatory Visit (INDEPENDENT_AMBULATORY_CARE_PROVIDER_SITE_OTHER): Payer: 59

## 2018-07-05 DIAGNOSIS — Z23 Encounter for immunization: Secondary | ICD-10-CM

## 2019-10-12 ENCOUNTER — Other Ambulatory Visit: Payer: Self-pay

## 2019-10-12 ENCOUNTER — Emergency Department: Payer: 59

## 2019-10-12 ENCOUNTER — Emergency Department
Admission: EM | Admit: 2019-10-12 | Discharge: 2019-10-12 | Disposition: A | Discharge status: Home / Self Care | Payer: 59 | Attending: Emergency Medicine | Admitting: Emergency Medicine

## 2019-10-12 ENCOUNTER — Encounter: Payer: Self-pay | Admitting: Emergency Medicine

## 2019-10-12 DIAGNOSIS — R0789 Other chest pain: Secondary | ICD-10-CM

## 2019-10-12 LAB — TROPONIN I (HIGH SENSITIVITY)
Troponin I (High Sensitivity): 9 ng/L (ref <18)
Troponin I (High Sensitivity): 7 ng/L (ref <18)

## 2019-10-12 LAB — CBC
HCT: 43.9 % (ref 39.0–52.0)
Hemoglobin: 14.7 g/dL (ref 13.0–17.0)
MCH: 27.3 pg (ref 26.0–34.0)
MCHC: 33.5 g/dL (ref 30.0–36.0)
MCV: 81.6 fL (ref 80.0–100.0)
Platelets: 270 10*3/uL (ref 150–400)
RBC: 5.38 MIL/uL (ref 4.22–5.81)
RDW: 12.1 % (ref 11.5–15.5)
WBC: 5.3 10*3/uL (ref 4.0–10.5)
nRBC: 0 % (ref 0.0–0.2)

## 2019-10-12 LAB — BASIC METABOLIC PANEL
Anion gap: 10 (ref 5–15)
BUN: 11 mg/dL (ref 6–20)
CO2: 26 mmol/L (ref 22–32)
Calcium: 9.6 mg/dL (ref 8.9–10.3)
Chloride: 104 mmol/L (ref 98–111)
Creatinine, Ser: 0.87 mg/dL (ref 0.61–1.24)
GFR calc Af Amer: >60 mL/min (ref >60)
GFR calc non Af Amer: >60 mL/min (ref >60)
Glucose, Bld: 104 mg/dL — ABNORMAL HIGH (ref 70–99)
Potassium: 4.1 mmol/L (ref 3.5–5.1)
Sodium: 140 mmol/L (ref 135–145)

## 2019-10-12 LAB — FIBRIN DERIVATIVES D-DIMER (ARMC ONLY): Fibrin derivatives D-dimer (ARMC): 177.16 ng/mL (FEU) (ref 0.00–499.00)

## 2019-10-12 MED ORDER — KETOROLAC TROMETHAMINE 30 MG/ML IJ SOLN
15.0000 mg | Freq: Once | INTRAMUSCULAR | Status: AC
  Administered 2019-10-12: 14:00:00 15 mg via INTRAVENOUS
  Filled 2019-10-12: qty 1

## 2019-10-12 MED ORDER — SODIUM CHLORIDE 0.9% FLUSH: 3.0000 mL | Freq: Once | INTRAVENOUS | Status: DC

## 2019-10-12 NOTE — Discharge Instructions (Signed)
Follow-up with your primary care provider if any continued problems.  Also begin taking the naproxen 500 mg with food daily for the next 3 to 4 days to see if this helps with your anterior chest wall pain.  Lab test, chest x-ray, EKG were all within normal limits and does not appear to be cardiac related.  The anti-inflammatory naproxen should help alleviate some of the inflammation in the muscles and bones to the anterior chest wall.  You may also use ice or heat to your chest to help with discomfort.

## 2019-10-12 NOTE — ED Notes (Signed)
Pt came from work with chest pain. States this has been going on for approx a year on and off but has never been seen for it. States pain is different today, like a "poke right above the heart".

## 2019-10-12 NOTE — ED Provider Notes (Addendum)
[facility] Emergency Department Provider Note  ____________________________________________   First MD Initiated Contact with Patient 10/12/19 1223     (approximate)  I have reviewed the triage vital signs and the nursing notes.   HISTORY  Chief Complaint Chest Pain   HPI Curtis Ramirez is a 24 y.o. male presents to the ED via EMS with complaint of left-sided chest wall pain.  Patient states that it has been going on for approximately 1 year off and on and that he has never been seen for it.  Patient states that the pain today while he was at work and is different feeling like there is a stabbing sensation right above his heart.  He denies any shortness of breath, lengthy travel, or injury to his lower extremities.  Patient is a non-smoker.  He denies any previous cardiac or GI issues.  Episodes in the past have never been evaluated and patient has not taken any over-the-counter medication.  He also denies use of alcohol or recreational drugs.     History reviewed. No pertinent past medical history.  There are no problems to display for this patient.   History reviewed. No pertinent surgical history.  Prior to Admission medications   Not on File    Allergies Patient has no known allergies.  History reviewed. No pertinent family history.  Social History Social History   Tobacco Use  . Smoking status: Never Smoker  . Smokeless tobacco: Never Used  Substance Use Topics  . Alcohol use: Not on file  . Drug use: Not on file    Review of Systems Constitutional: No fever/chills Cardiovascular: Denies chest pain. Respiratory: Denies shortness of breath.  Negative for cough. Gastrointestinal: No abdominal pain.  No nausea, no vomiting.  No diarrhea.  Musculoskeletal: Positive left anterior chest wall pain. Skin: Negative for rash. Neurological: Negative for headaches, focal weakness or  numbness. ___________________________________________   PHYSICAL EXAM:  VITAL SIGNS: ED Triage Vitals  Enc Vitals Group     BP 10/12/19 1038 134/71     Pulse Rate 10/12/19 1038 76     Resp 10/12/19 1038 18     Temp 10/12/19 1038 98.3 F (36.8 C)     Temp Source 10/12/19 1038 Oral     SpO2 10/12/19 1038 100 %     Weight 10/12/19 1033 163 lb (73.9 kg)     Height --      Head Circumference --      Peak Flow --      Pain Score 10/12/19 1033 0     Pain Loc --      Pain Edu? --      Excl. in [place]? --     Constitutional: Alert and oriented. Well appearing and in no acute distress.  Patient currently is sitting on the edge of the stretcher and does not appear to have any respiratory difficulty at this time.  Color is normal. Eyes: Conjunctivae are normal.  Head: Atraumatic. Neck: No stridor.   Cardiovascular: Normal rate, regular rhythm. Grossly normal heart sounds.  Good peripheral circulation. Respiratory: Normal respiratory effort.  No retractions. Lungs CTAB. Gastrointestinal: Soft and nontender. No distention. Musculoskeletal: Moves upper and lower extremities with any difficulty.  Normal gait was noted.  There is no gross deformity of the anterior chest wall.  Pain is not increased with exertion of the shoulders bilaterally. Neurologic:  Normal speech and language. No gross focal neurologic deficits are appreciated. No gait instability. Skin:  Skin is  warm, dry and intact. No rash noted. Psychiatric: Mood and affect are normal. Speech and behavior are normal.  ____________________________________________   LABS (all labs ordered are listed, but only abnormal results are displayed)  Labs Reviewed  BASIC METABOLIC PANEL - Abnormal; Notable for the following components:      Result Value   Glucose, Bld 104 (*)    All other components within normal limits  CBC  FIBRIN DERIVATIVES D-DIMER ([facility] ONLY)  TROPONIN I (HIGH SENSITIVITY)  TROPONIN I (HIGH SENSITIVITY)    ____________________________________________  EKG  EKG was reviewed by MD on major ED. Normal sinus rhythm with ventricular rate of 72, PR interval 144, QRS duration 96. ____________________________________________  RADIOLOGY  Official radiology report(s): DG Chest 2 View  Result Date: 10/12/2019 CLINICAL DATA:  24 year old male with chest pain EXAM: CHEST - 2 VIEW COMPARISON:  None. FINDINGS: The heart size and mediastinal contours are within normal limits. Both lungs are clear. The visualized skeletal structures are unremarkable. IMPRESSION: No active cardiopulmonary disease. Electronically Signed   By: Gilmer Mor D.O.   On: 10/12/2019 11:14    ____________________________________________   PROCEDURES  Procedure(s) performed (including Critical Care):  Procedures  ____________________________________________   INITIAL IMPRESSION / ASSESSMENT AND PLAN / ED COURSE  As part of my medical decision making, I reviewed the following data within the electronic MEDICAL RECORD NUMBER Notes from prior ED visits and [place] Controlled Substance Database  Curtis Ramirez was evaluated in Emergency Department on 10/12/2019 for the symptoms described in the history of present illness. He was evaluated in the context of the global COVID-19 pandemic, which necessitated consideration that the patient might be at risk for infection with the SARS-CoV-2 virus that causes COVID-19. Institutional protocols and algorithms that pertain to the evaluation of patients at risk for COVID-19 are in a state of rapid change based on information released by regulatory bodies including the CDC and federal and state organizations. These policies and algorithms were followed during the patient's care in the ED.  24 year old male presents to the ED with complaint of left anterior chest wall pain that started while he was at work today.  Patient states that he has been experiencing anterior chest pain for  approximately 1 year off and on but today seems to be worse.  He denies any difficulty breathing or previous cardiac issues.  Patient appears to be anxious that this is "heart pain".  History gives very low suspicion for PE or cardiac issues.  Troponin x2 was normal, D-dimer negative, baseline lab work was within normal limits.  Chest x-ray showed no cardiopulmonary disease.  EKG was within normal limits.  Patient was reassured that with the 2 - troponins, and other normal lab findings that we have eliminated any life-threatening illness or event.  He was given Toradol 15 mg IV prior to discharge.  Patient is to follow-up with [facility]  clinic acute care if any continued problems and return to the emergency department if any severe worsening of his symptoms or difficulty breathing.  ____________________________________________   FINAL CLINICAL IMPRESSION(S) / ED DIAGNOSES  Final diagnoses:  Anterior chest wall pain     ED Discharge Orders    None       Note:  This document was prepared using Dragon voice recognition software and may include unintentional dictation errors.    Tommi Rumps, PA-C 10/12/19 1459    Tommi Rumps, PA-C 10/12/19 1500    Charlynne Pander, MD 10/13/19 [phone]

## 2019-10-12 NOTE — ED Triage Notes (Signed)
C/O chest tightness this morning.  Arrives by EMS.  325 ASA given.  18g saline lock started .  VS wnl.  Arrives denying c/o pain.

## 2019-10-15 ENCOUNTER — Encounter: Payer: Self-pay | Admitting: Family Medicine

## 2021-01-14 ENCOUNTER — Encounter: Payer: Self-pay | Admitting: Family Medicine

## 2021-01-14 ENCOUNTER — Ambulatory Visit (INDEPENDENT_AMBULATORY_CARE_PROVIDER_SITE_OTHER): Payer: 59 | Admitting: Family Medicine

## 2021-01-14 ENCOUNTER — Other Ambulatory Visit: Payer: Self-pay

## 2021-01-14 VITALS — BP 131/78 | HR 86 | Temp 98.3°F | Ht 70.5 in | Wt 180.0 lb

## 2021-01-14 DIAGNOSIS — Z130 Encounter for screening for diseases of the blood and blood-forming organs and certain disorders involving the immune mechanism: Secondary | ICD-10-CM

## 2021-01-14 DIAGNOSIS — Z0000 Encounter for general adult medical examination without abnormal findings: Secondary | ICD-10-CM

## 2021-01-14 DIAGNOSIS — Z131 Encounter for screening for diabetes mellitus: Secondary | ICD-10-CM

## 2021-01-14 DIAGNOSIS — H6120 Impacted cerumen, unspecified ear: Secondary | ICD-10-CM

## 2021-01-14 DIAGNOSIS — Z13220 Encounter for screening for lipoid disorders: Secondary | ICD-10-CM | POA: Diagnosis not present

## 2021-01-14 LAB — HEMOGLOBIN A1C: Hgb A1c MFr Bld: 5.2 % (ref 4.6–6.5)

## 2021-01-14 LAB — COMPREHENSIVE METABOLIC PANEL
ALT: 27 U/L (ref 0–53)
AST: 26 U/L (ref 0–37)
Albumin: 5 g/dL (ref 3.5–5.2)
Alkaline Phosphatase: 81 U/L (ref 39–117)
BUN: 12 mg/dL (ref 6–23)
CO2: 32 mEq/L (ref 19–32)
Calcium: 10.3 mg/dL (ref 8.4–10.5)
Chloride: 101 mEq/L (ref 96–112)
Creatinine, Ser: 0.9 mg/dL (ref 0.40–1.50)
GFR: 118.67 mL/min (ref >60.00)
Glucose, Bld: 98 mg/dL (ref 70–99)
Potassium: 4.2 mEq/L (ref 3.5–5.1)
Sodium: 140 mEq/L (ref 135–145)
Total Bilirubin: 0.6 mg/dL (ref 0.2–1.2)
Total Protein: 7.4 g/dL (ref 6.0–8.3)

## 2021-01-14 LAB — CBC WITH DIFFERENTIAL/PLATELET
Basophils Absolute: 0 10*3/uL (ref 0.0–0.1)
Basophils Relative: 0.2 % (ref 0.0–3.0)
Eosinophils Absolute: 0.3 10*3/uL (ref 0.0–0.7)
Eosinophils Relative: 4.6 % (ref 0.0–5.0)
HCT: 43.4 % (ref 39.0–52.0)
Hemoglobin: 14.7 g/dL (ref 13.0–17.0)
Lymphocytes Relative: 25 % (ref 12.0–46.0)
Lymphs Abs: 1.4 10*3/uL (ref 0.7–4.0)
MCHC: 33.8 g/dL (ref 30.0–36.0)
MCV: 82.1 fl (ref 78.0–100.0)
Monocytes Absolute: 0.4 10*3/uL (ref 0.1–1.0)
Monocytes Relative: 6.9 % (ref 3.0–12.0)
Neutro Abs: 3.6 10*3/uL (ref 1.4–7.7)
Neutrophils Relative %: 63.3 % (ref 43.0–77.0)
Platelets: 250 10*3/uL (ref 150.0–400.0)
RBC: 5.29 Mil/uL (ref 4.22–5.81)
RDW: 13.1 % (ref 11.5–15.5)
WBC: 5.6 10*3/uL (ref 4.0–10.5)

## 2021-01-14 LAB — LIPID PANEL
Cholesterol: 167 mg/dL (ref 0–200)
HDL: 56.9 mg/dL (ref >39.00)
LDL Cholesterol: 100 mg/dL — ABNORMAL HIGH (ref 0–99)
NonHDL: 109.77
Total CHOL/HDL Ratio: 3
Triglycerides: 49 mg/dL (ref 0.0–149.0)
VLDL: 9.8 mg/dL (ref 0.0–40.0)

## 2021-01-14 MED ORDER — DEBROX 6.5 % OT SOLN: 5.0000 [drp] | Freq: Two times a day (BID) | OTIC | 3 refills | Status: DC

## 2021-01-14 NOTE — Progress Notes (Signed)
This visit occurred during the SARS-CoV-2 public health emergency.  Safety protocols were in place, including screening questions prior to the visit, additional usage of staff PPE, and extensive cleaning of exam room while observing appropriate contact time as indicated for disinfecting solutions.    Patient ID: Curtis Ramirez, male  DOB: 1996-03-15, 25 y.o.   MRN: [id number] Patient Care Team    Relationship Specialty Notifications Start End  Natalia Leatherwood, DO PCP - General Family Medicine  12/30/20   Natalia Leatherwood, DO  Family Medicine  10/12/19    Comment: Merged    Chief Complaint  Patient presents with  . Annual Exam    Pt is fasting    Subjective:  Curtis Ramirez is a 25 y.o. male present for CPE. All past medical history, surgical history, allergies, family history, immunizations, medications and social history were updated in the electronic medical record today. All recent labs, ED visits and hospitalizations within the last year were reviewed.  Health maintenance:  Colonoscopy: routine screen at 44 Immunizations:  tdap UTD 2017, influenza encouraged yearly. covid declined.  Infectious disease screening: HIV and  Hep C declined- g/c declined. Never SA per pt.  Patient has a Dental home.  Depression screen [facility] 2/9 01/26/2018 07/13/2016 06/22/2016 05/31/2016  Decreased Interest 0 0 0 0  Down, Depressed, Hopeless 0 0 0 0  PHQ - 2 Score 0 0 0 0   No flowsheet data found.      Fall Risk  07/13/2016 06/22/2016 05/31/2016  Falls in the past year? No No No  Risk for fall due to : Other (Comment) Other (Comment) -    Immunization History  Administered Date(s) Administered  . Influenza Split 07/09/2011, 06/30/2012  . Influenza,inj,Quad PF,6+ Mos 05/31/2016, 07/27/2017, 07/05/2018  . Influenza-Unspecified 07/27/2017  . Tdap 05/31/2016     Past Medical History:  Diagnosis Date  . Allergic rhinitis, cause unspecified   . Attention deficit  disorder with hyperactivity(314.01) 03/2013   mild to nonexistent per latest neuropsych testing per Dr. Archie Patten - now off straterra  . Bipolar I disorder (HCC)    mild, Dr. Archie Patten  . Migraine   . Palpitations    ?reviewed records from prior pcp - no mention of previous w/u   No Known Allergies Past Surgical History:  Procedure Laterality Date  . NO PAST SURGERIES     Family History  Problem Relation Age of Onset  . Alcohol abuse Other   . Bipolar disorder Other   . Drug abuse Other   . Early death Father    Social History   Social History Narrative   ** Merged History Encounter **       Lives with grandparents, uncle, great grandmother, outside cats Father died when patient was 89 y/o. Visualized his traumatic death  Mother with bipolar, drugs. Abandoned. Estranged, not involved. Multiple school suspensions Not doing well in school    Likes computers, online RPG's; Wii, DS No GF; not sexually active    Allergies as of 01/14/2021   No Known Allergies     Medication List       Accurate as of Jan 14, 2021  1:38 PM. If you have any questions, ask your nurse or doctor.        STOP taking these medications   amoxicillin-clavulanate 875-125 MG tablet Commonly known as: AUGMENTIN Stopped by: Felix Pacini, DO     TAKE these medications   Debrox 6.5 % OTIC solution Generic  drug: carbamide peroxide Place 5 drops into both ears 2 (two) times daily. Started by: Felix Pacini, DO   multivitamin tablet Take 1 tablet by mouth daily.      All past medical history, surgical history, allergies, family history, immunizations andmedications were updated in the EMR today and reviewed under the history and medication portions of their EMR.     No results found for this or any previous visit (from the past 2160 hour(s)).    ROS: 14 pt review of systems performed and negative (unless mentioned in an HPI)  Objective: BP 131/78   Pulse 86   Temp 98.3 F (36.8 C) (Oral)   Ht  5' 10.5" (1.791 m)   Wt 180 lb (81.6 kg)   SpO2 99%   BMI 25.46 kg/m  Gen: Afebrile. No acute distress. Nontoxic in appearance, well-developed, well-nourished,  male HENT: AT. [place]. Bilateral TM unable to be visualized 2/2 cerumen, normal external auditory canal. MMM, no oral lesions, adequate dentition. Bilateral nares within normal limits. Throat without erythema, ulcerations or exudates. no Cough on exam, no hoarseness on exam. Eyes:Pupils Equal Round Reactive to light, Extraocular movements intact,  Conjunctiva without redness, discharge or icterus. Neck/lymp/endocrine: Supple,no lymphadenopathy, no thyromegaly CV: RRR no murmur, no edema, +2/4 P posterior tibialis pulses.  Chest: CTAB, no wheeze, rhonchi or crackles. Normal Respiratory effort. Good Air movement. Abd: Soft. flat. NTND. BS present. no Masses palpated. No hepatosplenomegaly. No rebound tenderness or guarding. Skin: no rashes, purpura or petechiae. Warm and well-perfused. Skin intact. Neuro/Msk: Normal gait. PERLA. EOMi. Alert. Oriented x3.  Cranial nerves II through XII intact. Muscle strength 5/5 upper/lower extremity. DTRs equal bilaterally. Psych: Normal affect, dress and demeanor. Normal speech. Normal thought content and judgment.  No exam data present  Assessment/plan: Curtis Ramirez is a 25 y.o. male present for CPE Screening for deficiency anemia - CBC with Differential/Platelet Diabetes mellitus screening - Comprehensive metabolic panel - Hemoglobin A1c Lipid screening - Lipid panel Cerumen: Debrox prescribed.  Routine general medical examination at a health care facility Patient was encouraged to exercise greater than 150 minutes a week. Patient was encouraged to choose a diet filled with fresh fruits and vegetables, and lean meats. AVS provided to patient today for education/recommendation on gender specific health and safety maintenance. Colonoscopy: routine screen at 44 Immunizations:  tdap  UTD 2017, influenza encouraged yearly. covid declined.  Infectious disease screening: HIV and  Hep C declined- g/c declined. Never SA per pt.     Return in about 1 year (around 01/14/2022) for CPE (30 min).  Orders Placed This Encounter  Procedures  . CBC with Differential/Platelet  . Comprehensive metabolic panel  . Hemoglobin A1c  . Lipid panel   Meds ordered this encounter  Medications  . carbamide peroxide (DEBROX) 6.5 % OTIC solution    Sig: Place 5 drops into both ears 2 (two) times daily.    Dispense:  15 mL    Refill:  3   Referral Orders  No referral(s) requested today     Note is dictated utilizing voice recognition software. Although note has been proof read prior to signing, occasional typographical errors still can be missed. If any questions arise, please do not hesitate to call for verification.  Electronically signed by: Felix Pacini, DO [facility]

## 2021-01-14 NOTE — Patient Instructions (Signed)

## 2021-06-17 ENCOUNTER — Emergency Department (HOSPITAL_COMMUNITY): Payer: 59

## 2021-06-17 ENCOUNTER — Encounter (HOSPITAL_COMMUNITY): Payer: Self-pay | Admitting: Oncology

## 2021-06-17 ENCOUNTER — Emergency Department (HOSPITAL_COMMUNITY)
Admission: EM | Admit: 2021-06-17 | Discharge: 2021-06-18 | Disposition: A | Discharge status: Home / Self Care | Payer: 59 | Attending: Emergency Medicine | Admitting: Emergency Medicine

## 2021-06-17 ENCOUNTER — Other Ambulatory Visit: Payer: Self-pay

## 2021-06-17 DIAGNOSIS — R079 Chest pain, unspecified: Secondary | ICD-10-CM | POA: Insufficient documentation

## 2021-06-17 DIAGNOSIS — R000 Tachycardia, unspecified: Secondary | ICD-10-CM | POA: Diagnosis not present

## 2021-06-17 DIAGNOSIS — R0602 Shortness of breath: Secondary | ICD-10-CM | POA: Insufficient documentation

## 2021-06-17 DIAGNOSIS — R002 Palpitations: Secondary | ICD-10-CM

## 2021-06-17 LAB — CBC WITH DIFFERENTIAL/PLATELET
Abs Immature Granulocytes: 0.02 10*3/uL (ref 0.00–0.07)
Basophils Absolute: 0 10*3/uL (ref 0.0–0.1)
Basophils Relative: 0 %
Eosinophils Absolute: 0.2 10*3/uL (ref 0.0–0.5)
Eosinophils Relative: 3 %
HCT: 47.4 % (ref 39.0–52.0)
Hemoglobin: 15.9 g/dL (ref 13.0–17.0)
Immature Granulocytes: 0 %
Lymphocytes Relative: 21 %
Lymphs Abs: 1.4 10*3/uL (ref 0.7–4.0)
MCH: 28.1 pg (ref 26.0–34.0)
MCHC: 33.5 g/dL (ref 30.0–36.0)
MCV: 83.7 fL (ref 80.0–100.0)
Monocytes Absolute: 0.6 10*3/uL (ref 0.1–1.0)
Monocytes Relative: 8 %
Neutro Abs: 4.5 10*3/uL (ref 1.7–7.7)
Neutrophils Relative %: 68 %
Platelets: 270 10*3/uL (ref 150–400)
RBC: 5.66 MIL/uL (ref 4.22–5.81)
RDW: 12.1 % (ref 11.5–15.5)
WBC: 6.7 10*3/uL (ref 4.0–10.5)
nRBC: 0 % (ref 0.0–0.2)

## 2021-06-17 LAB — COMPREHENSIVE METABOLIC PANEL
ALT: 24 U/L (ref 0–44)
AST: 29 U/L (ref 15–41)
Albumin: 4.8 g/dL (ref 3.5–5.0)
Alkaline Phosphatase: 76 U/L (ref 38–126)
Anion gap: 6 (ref 5–15)
BUN: 11 mg/dL (ref 6–20)
CO2: 27 mmol/L (ref 22–32)
Calcium: 10.5 mg/dL — ABNORMAL HIGH (ref 8.9–10.3)
Chloride: 104 mmol/L (ref 98–111)
Creatinine, Ser: 0.89 mg/dL (ref 0.61–1.24)
GFR, Estimated: >60 mL/min (ref >60)
Glucose, Bld: 97 mg/dL (ref 70–99)
Potassium: 3.7 mmol/L (ref 3.5–5.1)
Sodium: 137 mmol/L (ref 135–145)
Total Bilirubin: 0.7 mg/dL (ref 0.3–1.2)
Total Protein: 7.6 g/dL (ref 6.5–8.1)

## 2021-06-17 LAB — TROPONIN I (HIGH SENSITIVITY): Troponin I (High Sensitivity): 4 ng/L (ref <18)

## 2021-06-17 LAB — MAGNESIUM: Magnesium: 1.8 mg/dL (ref 1.7–2.4)

## 2021-06-17 MED ORDER — SODIUM CHLORIDE 0.9 % IV BOLUS
1000.0000 mL | Freq: Once | INTRAVENOUS | Status: AC
  Administered 2021-06-17: 1000 mL via INTRAVENOUS

## 2021-06-17 NOTE — ED Provider Notes (Signed)
Emergency Medicine Provider Triage Evaluation Note  Curtis Ramirez , a 25 y.o. male  was evaluated in triage.  Pt complains of chest tightness and increased heart rate.  Patient states that he was sitting in his car about 30 minutes prior to arrival and was watching television.  He began experiencing central chest tightness.  No visual changes, shortness of breath, nausea, vomiting, diaphoresis.  This lasted about 5 minutes.  While ambulating he checked his smart watch and noticed that his heart rate was briefly about 160 bpm.  Denies any drug use.  States that he takes a caffeine supplement and never takes more than 200 mg of caffeine per day.  He does note that he was drinking the caffeine supplement prior to the onset of his symptoms today.  States that his symptoms have almost completely resolved since arriving to the emergency department.  Physical Exam  BP 140/88 (BP Location: Left Arm)   Pulse (!) 114   Temp 98.2 F (36.8 C) (Oral)   Resp 13   SpO2 99%  Gen:   Awake, no distress   Resp:  Normal effort  MSK:   Moves extremities without difficulty  Other:    Medical Decision Making  Medically screening exam initiated at 6:06 PM.  Appropriate orders placed.  Curtis Ramirez was informed that the remainder of the evaluation will be completed by another provider, this initial triage assessment does not replace that evaluation, and the importance of remaining in the ED until their evaluation is complete.   Curtis Sou, PA-C 06/17/21 1807    Cheryll Cockayne, MD 06/19/21 1526

## 2021-06-17 NOTE — ED Notes (Signed)
Attempted lab draw x 2 but unsuccessful. 

## 2021-06-17 NOTE — ED Triage Notes (Signed)
Pt reports sudden onset central CP. Pt has approximately 5 min of active CP.  Denies associated sx.

## 2021-06-18 ENCOUNTER — Encounter (HOSPITAL_COMMUNITY): Payer: Self-pay

## 2021-06-18 ENCOUNTER — Telehealth: Payer: Self-pay

## 2021-06-18 ENCOUNTER — Emergency Department (HOSPITAL_COMMUNITY): Payer: 59

## 2021-06-18 LAB — TSH: TSH: 3.711 u[IU]/mL (ref 0.350–4.500)

## 2021-06-18 LAB — D-DIMER, QUANTITATIVE: D-Dimer, Quant: 8.25 ug/mL-FEU — ABNORMAL HIGH (ref 0.00–0.50)

## 2021-06-18 LAB — TROPONIN I (HIGH SENSITIVITY): Troponin I (High Sensitivity): 3 ng/L (ref <18)

## 2021-06-18 MED ORDER — IOHEXOL 350 MG/ML SOLN
80.0000 mL | Freq: Once | INTRAVENOUS | Status: AC | PRN
  Administered 2021-06-18: 80 mL via INTRAVENOUS

## 2021-06-18 NOTE — Telephone Encounter (Signed)
Pt seen in ED for palpitations and tachycardia   [facility] - Client Client Site  [facility] - Day Physician Claiborne [place], [place] Contact Type Call Who Is Calling Patient / Member / Family / Caregiver Call Type Triage / Clinical Relationship To Patient Self Return Phone Number [phone] (Secondary) Chief Complaint CHEST PAIN - pain, pressure, heaviness or tightness Reason for Call Symptomatic / Request for Health Information Initial Comment Caller states he would like to schedule an appointment as soon as possible. For a few weeks he's had some tightness in the chest due to indigestion. Any time he burps it relieves the tightness in his chest. He is currently having chest pain. Translation No Nurse Assessment Nurse: Zenda Alpers, RN, Vickie Epley Date/Time (Eastern Time): 06/17/2021 5:10:55 PM Confirm and document reason for call. If symptomatic, describe symptoms. ---Caller states he would like to schedule an appointment as soon as possible. For a few weeks he's had some tightness in the chest due to indigestion. Any time he burps it relieves the tightness in his chest. He is currently having chest pain. States the pain is subsiding Does the patient have any new or worsening symptoms? ---Yes Will a triage be completed? ---Yes Related visit to physician within the last 2 weeks? ---No Does the PT have any chronic conditions? (i.e. diabetes, asthma, this includes High risk factors for pregnancy, etc.) ---No Is this a behavioral health or substance abuse call? ---No Guidelines Guideline Title Affirmed Question Affirmed Notes Nurse Date/Time (Eastern Time) Chest Pain [1] Chest pain lasts > 5 minutes AND [2] occurred in past 3 days (72 hours) (Exception: feels Zenda Alpers, RN, Vickie Epley 06/17/2021 5:13:03 PM PLEASE NOTE: All timestamps contained within this report are represented as [place] Standard Time. CONFIDENTIALTY NOTICE: This fax transmission  is intended only for the addressee. It contains information that is legally privileged, confidential or otherwise protected from use or disclosure. If you are not the intended recipient, you are strictly prohibited from reviewing, disclosing, copying using or disseminating any of this information or taking any action in reliance on or regarding this information. If you have received this fax in error, please notify us immediately by telephone so that we can arrange for its return to [place]. Phone: [phone], Toll-Free: [phone], Fax: [phone] Page: 2 of 2 Call Id: [id number] Guidelines Guideline Title Affirmed Question Affirmed Notes Nurse Date/Time Lamount Cohen Time) exactly the same as previously diagnosed heartburn and has accompanying sour taste in mouth) Disp. Time Lamount Cohen Time) Disposition Final User 06/17/2021 5:09:10 PM Send to Urgent Lorrin Goodell 06/17/2021 5:27:09 PM Go to ED Now (or PCP triage) Yes Zenda Alpers, RN, Vickie Epley Caller Disagree/Comply Comply Caller Understands Yes PreDisposition Call Doctor Care Advice Given Per Guideline GO TO ED NOW (OR PCP TRIAGE): * IF PCP SECOND-LEVEL TRIAGE REQUIRED: You may need to be seen. Your doctor (or NP/PA) will want to talk with you to decide what's best. I'll page the provider on-call now. If you haven't heard from the provider (or me) within 30 minutes, go directly to the ED/UCC at _____________ Hospital. * Severe difficulty breathing occurs CALL EMS IF: * Passes out or becomes too weak to stand * You become worse CARE ADVICE given per Chest Pain (Adult) guideline. Comments User: Volney American, RN Date/Time Lamount Cohen Time): 06/17/2021 5:13:43 PM Rates pain 0.5/10 currently. earlier 4-5/10 User: Volney American, RN Date/Time (Eastern Time): 06/17/2021 5:20:29 PM States he thinks the pain is related to his digestion Referrals GO TO FACILITY UNDECIDE

## 2021-06-18 NOTE — ED Provider Notes (Signed)
[facility]-EMERGENCY DEPT Provider Note   CSN: [id number] Arrival date & time: 06/17/21  1743     History Chief Complaint  Patient presents with   Chest Pain    Curtis Ramirez is a 25 y.o. male history of bipolar, ADHD, here presenting with shortness of breath and palpitation and chest pain.  Patient states that he does take some caffeine supplements at baseline.  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 He states that he noticed yesterday that his heart rate went up to about 160 and he had some palpitations and nausea.  He states that he had another episode today and had some nausea and palpitations.  He states that he walked over here and he notices heart rate went up to 160 and quickly resolved when he got to the ED.  Denies any recent travel history of blood clots.  Denies any CAD or stents.  Denies any history of arrhythmias  The history is provided by the patient.      Past Medical History:  Diagnosis Date   Allergic rhinitis, cause unspecified    Attention deficit disorder with hyperactivity(314.01) 03/2013   mild to nonexistent per latest neuropsych testing per Dr. Archie Patten - now off straterra   Bipolar I disorder (HCC)    mild, Dr. Archie Patten   Migraine    Palpitations    ?reviewed records from prior pcp - no mention of previous w/u    Patient Active Problem List   Diagnosis Date Noted   Right wrist pain 05/31/2016   BIPOLAR DISORDER UNSPECIFIED 02/21/2009    Past Surgical History:  Procedure Laterality Date   NO PAST SURGERIES         Family History  Problem Relation Age of Onset   Alcohol abuse Other    Bipolar disorder Other    Drug abuse Other    Early death Father     Social History   Tobacco Use   Smoking status: Never   Smokeless tobacco: Never  Substance Use Topics   Alcohol use: No   Drug use: No    Home Medications Prior to Admission medications   Medication Sig Start  Date End Date Taking? Authorizing Provider  carbamide peroxide (DEBROX) 6.5 % OTIC solution Place 5 drops into both ears 2 (two) times daily. 01/14/21   Kuneff, Renee A, DO  Multiple Vitamin (MULTIVITAMIN) tablet Take 1 tablet by mouth daily.    [provider]    Allergies    Patient has no known allergies.  Review of Systems   Review of Systems  Cardiovascular:  Positive for chest pain and palpitations.  All other systems reviewed and are negative.  Physical Exam Updated Vital Signs BP 135/79   Pulse 84   Temp 98.2 F (36.8 C) (Oral)   Resp 19   Ht 6' (1.829 m)   Wt 83.9 kg   SpO2 100%   BMI 25.09 kg/m   Physical Exam Vitals and nursing note reviewed.  Constitutional:      Appearance: He is well-developed.  HENT:     Head: Normocephalic.  Eyes:     Extraocular Movements: Extraocular movements intact.     Pupils: Pupils are equal, round, and reactive to light.  Cardiovascular:     Rate and Rhythm: Regular rhythm. Tachycardia present.     Heart sounds: Normal heart sounds.  Pulmonary:     Effort: Pulmonary effort is normal.  Breath sounds: Normal breath sounds.  Abdominal:     General: Bowel sounds are normal.     Palpations: Abdomen is soft.  Musculoskeletal:        General: Normal range of motion.     Cervical back: Normal range of motion and neck supple.  Skin:    General: Skin is warm.  Neurological:     General: No focal deficit present.     Mental Status: He is alert and oriented to person, place, and time.  Psychiatric:        Mood and Affect: Mood normal.        Behavior: Behavior normal.    ED Results / Procedures / Treatments   Labs (all labs ordered are listed, but only abnormal results are displayed) Labs Reviewed  COMPREHENSIVE METABOLIC PANEL - Abnormal; Notable for the following components:      Result Value   Calcium 10.5 (*)    All other components within normal limits  D-DIMER, QUANTITATIVE - Abnormal; Notable for the  following components:   D-Dimer, Quant 8.25 (*)    All other components within normal limits  CBC WITH DIFFERENTIAL/PLATELET  MAGNESIUM  TSH  TROPONIN I (HIGH SENSITIVITY)  TROPONIN I (HIGH SENSITIVITY)    EKG EKG Interpretation  Date/Time:  Wednesday June 17 2021 17:59:28 EDT Ventricular Rate:  113 PR Interval:  128 QRS Duration: 91 QT Interval:  308 QTC Calculation: 423 R Axis:   86 Text Interpretation: Sinus tachycardia Right atrial enlargement No previous ECGs available Confirmed by Richardean Canal [phone]) on 06/17/2021 11:18:48 PM  Radiology DG Chest 2 View  Result Date: 06/17/2021 CLINICAL DATA:  Chest tightness.  Chest pain. EXAM: CHEST - 2 VIEW COMPARISON:  None. FINDINGS: The heart size and mediastinal contours are within normal limits. Both lungs are clear. The visualized skeletal structures are unremarkable. IMPRESSION: No active cardiopulmonary disease. Electronically Signed   By: Darliss Cheney M.D.   On: 06/17/2021 19:50   CT Angio Chest PE W and/or Wo Contrast  Result Date: 06/18/2021 CLINICAL DATA:  Chest tightness. EXAM: CT ANGIOGRAPHY CHEST WITH CONTRAST TECHNIQUE: Multidetector CT imaging of the chest was performed using the standard protocol during bolus administration of intravenous contrast. Multiplanar CT image reconstructions and MIPs were obtained to evaluate the vascular anatomy. CONTRAST:  56mL OMNIPAQUE IOHEXOL 350 MG/ML SOLN COMPARISON:  None. FINDINGS: Cardiovascular: Satisfactory opacification of the pulmonary arteries to the segmental level. No evidence of pulmonary embolism. Normal heart size. No pericardial effusion. Mediastinum/Nodes: No enlarged mediastinal, hilar, or axillary lymph nodes. Thyroid gland, trachea, and esophagus demonstrate no significant findings. Lungs/Pleura: Lungs are clear. No pleural effusion or pneumothorax. Upper Abdomen: No acute abnormality. Musculoskeletal: No chest wall abnormality. No acute or significant osseous findings.  Review of the MIP images confirms the above findings. IMPRESSION: Negative examination for pulmonary embolism or acute cardiopulmonary disease. Electronically Signed   By: Aram Candela M.D.   On: 06/18/2021 02:09    Procedures Procedures   Medications Ordered in ED Medications  sodium chloride 0.9 % bolus 1,000 mL (0 mLs Intravenous Stopped 06/18/21 0101)  iohexol (OMNIPAQUE) 350 MG/ML injection 80 mL (80 mLs Intravenous Contrast Given 06/18/21 0151)    ED Course  I have reviewed the triage vital signs and the nursing notes.  Pertinent labs & imaging results that were available during my care of the patient were reviewed by me and considered in my medical decision making (see chart for details).    MDM Rules/Calculators/A&P  Curtis Ramirez is a 26 y.o. male here presenting with palpitations and tachycardia and chest pain shortness of breath.  Patient was in sinus tachycardia rate of 110 on arrival.  Consider side effect of caffeine versus dehydration versus hyperthyroidism versus PE.  Plan to get CBC and CMP and troponin x2, D-dimer, chest x-ray.  2:24 AM D-dimer was elevated but CTA did not show any blood clots.  TSH is normal and troponin negative x2.  I think his palpitations likely secondary to caffeine use.  Told him to cut back on caffeine. Stable for discharge  Final Clinical Impression(s) / ED Diagnoses Final diagnoses:  None    Rx / DC Orders ED Discharge Orders     None        Charlynne Pander, MD 06/18/21 0225

## 2021-06-18 NOTE — Discharge Instructions (Signed)
Your heart rate is elevated likely from caffeine use.  Your CT scan did not show any blood clots and your heart function is normal right now.  Your thyroid function is also normal.  Please cut back on caffeine and stay hydrated  Follow-up with your doctor  Return to ER if you have worse palpitations, chest pain, shortness of breath.

## 2021-06-29 ENCOUNTER — Other Ambulatory Visit: Payer: Self-pay

## 2021-06-29 ENCOUNTER — Encounter: Payer: Self-pay | Admitting: Family Medicine

## 2021-06-29 ENCOUNTER — Ambulatory Visit: Payer: 59 | Admitting: Family Medicine

## 2021-06-29 VITALS — BP 113/70 | HR 90 | Temp 98.1°F | Ht 71.0 in | Wt 178.0 lb

## 2021-06-29 DIAGNOSIS — R002 Palpitations: Secondary | ICD-10-CM | POA: Diagnosis not present

## 2021-06-29 DIAGNOSIS — R0789 Other chest pain: Secondary | ICD-10-CM

## 2021-06-29 NOTE — Patient Instructions (Addendum)
Palpitations Palpitations are feelings that your heartbeat is not normal. Your heartbeat may feel like it is: Uneven. Faster than normal. Fluttering. Skipping a beat. This is usually not a serious problem. In some cases, you may need tests to rule out any serious problems. Follow these instructions at home: Pay attention to any changes in your condition. Take these actions to help manage your symptoms: Eating and drinking Avoid: Coffee, tea, soft drinks, and energy drinks. Chocolate. Alcohol. Diet pills. Lifestyle  Try to lower your stress. These things can help you relax: Yoga. Deep breathing and meditation. Exercise. Using words and images to create positive thoughts (guided imagery). Using your mind to control things in your body (biofeedback). Do not use drugs. Get plenty of rest and sleep. Keep a regular bed time. General instructions  Take over-the-counter and prescription medicines only as told by your doctor. Do not use any products that contain nicotine or tobacco, such as cigarettes and e-cigarettes. If you need help quitting, ask your doctor. Keep all follow-up visits as told by your doctor. This is important. You may need more tests if palpitations do not go away or get worse. Contact a doctor if: Your symptoms last more than 24 hours. Your symptoms occur more often. Get help right away if you: Have chest pain. Feel short of breath. Have a very bad headache. Feel dizzy. Pass out (faint). Summary Palpitations are feelings that your heartbeat is uneven or faster than normal. It may feel like your heart is fluttering or skipping a beat. Avoid food and drinks that may cause palpitations. These include caffeine, chocolate, and alcohol. Try to lower your stress. Do not smoke or use drugs. Get help right away if you faint or have chest pain, shortness of breath, a severe headache, or dizziness. This information is not intended to replace advice given to you by your  health care provider. Make sure you discuss any questions you have with your health care provider. Document Revised: 12/26/2020 Document Reviewed: 10/12/2017 Elsevier Patient Education  2022 [employer] Inc.   Hypercalcemia Hypercalcemia is when the level of calcium in a person's blood is above normal. The body needs calcium to make bones and keep them strong. Calcium also helps the muscles, nerves, brain, and heart work the way they should. Most of the calcium in the body is in the bones. There is also some calcium in the blood. Hypercalcemia can happen when calcium comes out of the bones, or when the kidneys are not able to remove calcium from the blood. Hypercalcemia can be mild or severe. What are the causes? There are many possible causes of hypercalcemia. Common causes of this condition include: Hyperparathyroidism. This is a condition in which the body produces too much parathyroid hormone. There are four parathyroid glands in your neck. These glands produce a chemical messenger (hormone) that helps the body absorb calcium from foods and helps your bones release calcium. Certain kinds of cancer. Less common causes of hypercalcemia include: Getting too much calcium or vitamin D from your diet. Kidney failure. Hyperthyroidism. Severe dehydration. Being on bed rest or being inactive for a long time. Certain medicines. Infections. What increases the risk? You are more likely to develop this condition if you: Are male. Are 76 years of age or older. Have a family history of hypercalcemia. What are the signs or symptoms? Mild hypercalcemia that starts slowly may not cause symptoms. Severe, sudden hypercalcemia is more likely to cause symptoms, such as: Being more thirsty than usual. Needing to  urinate more often than usual. Abdominal pain. Nausea and vomiting. Constipation. Muscle pain, twitching, or weakness. Feeling very tired. How is this diagnosed? Hypercalcemia is usually  diagnosed with a blood test. You may also have tests to help determine what is causing this condition, such as imaging tests and more blood tests. How is this treated? Treatment for hypercalcemia depends on the cause. Treatment may include: Receiving fluids through an IV. Medicines that: Keep calcium levels steady after receiving fluids (loop diuretics). Keep calcium in your bones (bisphosphonates). Lower the calcium level in your blood. Surgery to remove overactive parathyroid glands. A procedure that filters your blood to correct calcium levels (hemodialysis). Follow these instructions at home:  Take over-the-counter and prescription medicines only as told by your health care provider. Follow instructions from your health care provider about eating or drinking restrictions. Drink enough fluid to keep your urine pale yellow. Stay active. Weight-bearing exercise helps to keep calcium in your bones. Follow instructions from your health care provider about what type and level of exercise is safe for you. Keep all follow-up visits as told by your health care provider. This is important. Contact a health care provider if you have: A fever. A heartbeat that is irregular or very fast. Changes in mood, memory, or personality. Get help right away if you: Have severe abdominal pain. Have chest pain. Have trouble breathing. Become very confused and sleepy. Lose consciousness. Summary Hypercalcemia is when the level of calcium in a person's blood is above normal. The body needs calcium to make bones and keep them strong. Calcium also helps the muscles, nerves, brain, and heart work the way they should. There are many possible causes of hypercalcemia, and treatment depends on the cause. Take over-the-counter and prescription medicines only as told by your health care provider. Follow instructions from your health care provider about eating or drinking restrictions. This information is not  intended to replace advice given to you by your health care provider. Make sure you discuss any questions you have with your health care provider. Document Revised: 11/12/2020 Document Reviewed: 06/05/2018 Elsevier Patient Education  2022 [employer].

## 2021-06-29 NOTE — Progress Notes (Signed)
This visit occurred during the SARS-CoV-2 public health emergency.  Safety protocols were in place, including screening questions prior to the visit, additional usage of staff PPE, and extensive cleaning of exam room while observing appropriate contact time as indicated for disinfecting solutions.    Curtis Ramirez , 1996-08-29, 25 y.o., male MRN: [id number] Patient Care Team    Relationship Specialty Notifications Start End  Natalia Leatherwood, DO PCP - General Family Medicine  12/30/20   Natalia Leatherwood, DO  Family Medicine  10/12/19    Comment: Merged    Chief Complaint  Patient presents with   Hospitalization Follow-up    ED f/u     Subjective: Pt presents for an OV after ED visit for chest discomfort and palpitations.  He states he had been drinking quite a bit of caffeine prior to onset of symptoms.  He was seen in the ED in which he had an elevated D-dimer, CTA was normal.  He denies any leg or calf pain or swelling, or fevers or chills.  TSH was in normal range and CMP unremarkable with the exception of mildly elevated calcium levels. Patient reports his symptoms have not returned since being seen in the emergency room.  He has cut back his caffeine consumption and increased his water consumption over the last week.  Depression screen [facility] 2/9 01/26/2018 07/13/2016 06/22/2016 05/31/2016  Decreased Interest 0 0 0 0  Down, Depressed, Hopeless 0 0 0 0  PHQ - 2 Score 0 0 0 0    No Known Allergies Social History   Social History Narrative   ** Merged History Encounter **       Lives with grandparents, uncle, great grandmother, outside cats Father died when patient was 68 y/o. Visualized his traumatic death  Mother with bipolar, drugs. Abandoned. Estranged, not involved. Multiple school suspensions Not doing well in school    [employer], online RPG's; Wii, DS No GF; not sexually active   Past Medical History:  Diagnosis Date   Allergic rhinitis, cause  unspecified    Attention deficit disorder with hyperactivity(314.01) 03/2013   mild to nonexistent per latest neuropsych testing per Dr. Archie Patten - now off straterra   Bipolar I disorder (HCC)    mild, Dr. Archie Patten   Migraine    Palpitations    ?reviewed records from prior pcp - no mention of previous w/u   Past Surgical History:  Procedure Laterality Date   NO PAST SURGERIES     Family History  Problem Relation Age of Onset   Alcohol abuse Other    Bipolar disorder Other    Drug abuse Other    Early death Father    Allergies as of 06/29/2021   No Known Allergies      Medication List        Accurate as of June 29, 2021 12:04 PM. If you have any questions, ask your nurse or doctor.          STOP taking these medications    Debrox 6.5 % OTIC solution Generic drug: carbamide peroxide Stopped by: Felix Pacini, DO   multivitamin tablet Stopped by: Felix Pacini, DO       TAKE these medications    MULTIVITAMIN ADULT PO Take by mouth.        All past medical history, surgical history, allergies, family history, immunizations andmedications were updated in the EMR today and reviewed under the history and medication portions of their EMR.  ROS: Negative, with the exception of above mentioned in HPI   Objective:  BP 113/70   Pulse 90   Temp 98.1 F (36.7 C) (Oral)   Ht 5\' 11"  (1.803 m)   Wt 178 lb (80.7 kg)   SpO2 98%   BMI 24.83 kg/m  Body mass index is 24.83 kg/m. Gen: Afebrile. No acute distress. Nontoxic in appearance, well developed, well nourished.  Pleasant male HENT: AT. [place].  No cough.  No hoarseness. Eyes:Pupils Equal Round Reactive to light, Extraocular movements intact,  Conjunctiva without redness, discharge or icterus. CV: RRR no murmur Chest: CTAB, no wheeze or crackles. Good air movement, normal resp effort. .  Neuro:  Normal gait. PERLA. EOMi. Alert. Oriented x3  Psych: Normal affect, dress and demeanor. Normal speech. Normal  thought content and judgment.  No results found. No results found. No results found for this or any previous visit (from the past 24 hour(s)).  Assessment/Plan: Curtis Ramirez is a 25 y.o. male present for OV for  Palpitations/chest discomfort [place] multifactorial.  Certainly his caffeine consumption played a role.  He has not had any recurrent symptoms since cutting back on his caffeine use. Discussed cardiology referral and Zio patch monitoring with him today, he would like to wait on this for now.  He feels that his caffeine consumption was the cause of his symptoms.  Hypercalcemia Patient was encouraged to not take any added calcium supplement and to ensure he is hydrating well with water.  He does drink a great deal of milk daily   Reviewed expectations re: course of current medical issues. Discussed self-management of symptoms. Outlined signs and symptoms indicating need for more acute intervention. Patient verbalized understanding and all questions were answered. Patient received an After-Visit Summary.    No orders of the defined types were placed in this encounter.  No orders of the defined types were placed in this encounter.  Referral Orders  No referral(s) requested today     Note is dictated utilizing voice recognition software. Although note has been proof read prior to signing, occasional typographical errors still can be missed. If any questions arise, please do not hesitate to call for verification.   electronically signed by:  30, DO  [facility] [place]
